# Patient Record
Sex: Female | Born: 1980 | Race: White | Hispanic: No | Smoking: Former smoker
Health system: Southern US, Community
[De-identification: ages and names within clinical notes are randomized; demographics above are authoritative.]

## PROBLEM LIST (undated history)

## (undated) HISTORY — PX: KNEE SURGERY: SHX244

## (undated) HISTORY — PX: MANDIBLE SURGERY: SHX707

## (undated) HISTORY — PX: APPENDECTOMY: SHX54

---

## 2013-11-29 ENCOUNTER — Ambulatory Visit: Payer: Self-pay | Admitting: Physical Therapy

## 2013-12-24 ENCOUNTER — Encounter: Payer: Self-pay | Admitting: Obstetrics & Gynecology

## 2013-12-24 DIAGNOSIS — Z01419 Encounter for gynecological examination (general) (routine) without abnormal findings: Secondary | ICD-10-CM

## 2015-02-20 MED FILL — FLUoxetine HCL 40 MG CAPS: 40 | 30 days supply | Qty: 30 | Fill #3

## 2015-03-17 MED FILL — FLUoxetine HCL 40 MG CAPS: 40 | 30 days supply | Qty: 30 | Fill #4

## 2015-04-28 MED FILL — FLUoxetine HCL 40 MG CAPS: 40 | 30 days supply | Qty: 30 | Fill #5

## 2015-06-06 DIAGNOSIS — F322 Major depressive disorder, single episode, severe without psychotic features: Secondary | ICD-10-CM | POA: Diagnosis not present

## 2015-06-06 MED FILL — FLUoxetine HCL 40 MG CAPS: 40 | 90 days supply | Qty: 90 | Fill #0

## 2015-10-07 MED FILL — FLUoxetine HCL 40 MG CAPS: 40 | 90 days supply | Qty: 90 | Fill #1

## 2016-03-26 DIAGNOSIS — F322 Major depressive disorder, single episode, severe without psychotic features: Secondary | ICD-10-CM | POA: Diagnosis not present

## 2016-03-26 DIAGNOSIS — Z0000 Encounter for general adult medical examination without abnormal findings: Secondary | ICD-10-CM | POA: Diagnosis not present

## 2016-03-26 DIAGNOSIS — Z01419 Encounter for gynecological examination (general) (routine) without abnormal findings: Secondary | ICD-10-CM | POA: Diagnosis not present

## 2016-03-26 MED FILL — FLUoxetine HCL 40 MG CAPS: 40 | 90 days supply | Qty: 90 | Fill #0

## 2016-04-09 DIAGNOSIS — R748 Abnormal levels of other serum enzymes: Secondary | ICD-10-CM | POA: Diagnosis not present

## 2016-04-16 DIAGNOSIS — H527 Unspecified disorder of refraction: Secondary | ICD-10-CM | POA: Diagnosis not present

## 2016-06-10 MED FILL — FLUoxetine HCL 40 MG CAPS: 40 | 90 days supply | Qty: 90 | Fill #0

## 2016-10-12 DIAGNOSIS — N3000 Acute cystitis without hematuria: Secondary | ICD-10-CM | POA: Diagnosis not present

## 2016-10-12 DIAGNOSIS — R109 Unspecified abdominal pain: Secondary | ICD-10-CM | POA: Diagnosis not present

## 2016-10-12 DIAGNOSIS — M545 Low back pain: Secondary | ICD-10-CM | POA: Diagnosis not present

## 2016-10-26 MED FILL — FLUoxetine HCL 40 MG CAPS: 40 | 90 days supply | Qty: 90 | Fill #1

## 2017-01-10 DIAGNOSIS — M79641 Pain in right hand: Secondary | ICD-10-CM | POA: Diagnosis not present

## 2017-01-10 DIAGNOSIS — M189 Osteoarthritis of first carpometacarpal joint, unspecified: Secondary | ICD-10-CM | POA: Diagnosis not present

## 2017-07-25 DIAGNOSIS — F322 Major depressive disorder, single episode, severe without psychotic features: Secondary | ICD-10-CM | POA: Diagnosis not present

## 2017-07-25 DIAGNOSIS — Z0000 Encounter for general adult medical examination without abnormal findings: Secondary | ICD-10-CM | POA: Diagnosis not present

## 2017-07-25 DIAGNOSIS — N898 Other specified noninflammatory disorders of vagina: Secondary | ICD-10-CM | POA: Diagnosis not present

## 2017-07-25 DIAGNOSIS — Z23 Encounter for immunization: Secondary | ICD-10-CM | POA: Diagnosis not present

## 2017-07-25 MED FILL — FLUoxetine HCL 40 MG CAPS: 40 | 30 days supply | Qty: 30 | Fill #0

## 2017-07-25 MED FILL — FLUCONAZOLE 150 MG TABS: 150 | 1 days supply | Qty: 1 | Fill #0

## 2017-08-15 ENCOUNTER — Emergency Department
Admission: EM | Admit: 2017-08-15 | Discharge: 2017-08-15 | Disposition: A | Discharge status: Home / Self Care | Payer: 59 | Source: Home / Self Care | Attending: Family Medicine | Admitting: Family Medicine

## 2017-08-15 ENCOUNTER — Emergency Department (INDEPENDENT_AMBULATORY_CARE_PROVIDER_SITE_OTHER): Payer: 59

## 2017-08-15 ENCOUNTER — Other Ambulatory Visit: Payer: Self-pay

## 2017-08-15 DIAGNOSIS — M79672 Pain in left foot: Secondary | ICD-10-CM | POA: Diagnosis not present

## 2017-08-15 DIAGNOSIS — M25572 Pain in left ankle and joints of left foot: Secondary | ICD-10-CM

## 2017-08-15 DIAGNOSIS — S99912A Unspecified injury of left ankle, initial encounter: Secondary | ICD-10-CM | POA: Diagnosis not present

## 2017-08-15 DIAGNOSIS — M79662 Pain in left lower leg: Secondary | ICD-10-CM | POA: Diagnosis not present

## 2017-08-15 DIAGNOSIS — M7989 Other specified soft tissue disorders: Secondary | ICD-10-CM | POA: Diagnosis not present

## 2017-08-15 DIAGNOSIS — M79605 Pain in left leg: Secondary | ICD-10-CM | POA: Diagnosis not present

## 2017-08-15 DIAGNOSIS — S99922A Unspecified injury of left foot, initial encounter: Secondary | ICD-10-CM | POA: Diagnosis not present

## 2017-08-15 DIAGNOSIS — M7732 Calcaneal spur, left foot: Secondary | ICD-10-CM | POA: Diagnosis not present

## 2017-08-15 DIAGNOSIS — S8992XA Unspecified injury of left lower leg, initial encounter: Secondary | ICD-10-CM | POA: Diagnosis not present

## 2017-08-15 NOTE — ED Triage Notes (Signed)
Got up from couch around 130 today and foot was asleep.  Rolled left ankle.  Swelling on the lateral aspect of ankle and pin in the shin area.

## 2017-08-15 NOTE — Discharge Instructions (Signed)
°  You may take 500mg  acetaminophen every 4-6 hours or in combination with ibuprofen 400-600mg  every 6-8 hours as needed for pain and inflammation.  Please follow up with family medicine or sports medicine in 1-2 weeks if not improving, sooner if worsening.

## 2017-08-15 NOTE — ED Provider Notes (Signed)
[facility]    CSN: [id number] Arrival date & time: 08/15/17  1523     History   Chief Complaint Chief Complaint  Patient presents with  . Ankle Pain    HPI Maria Wilkins is a 37 y.o. female.   HPI  Maria Wilkins is a 37 y.o. female presenting to UC with c/o Left lower leg, ankle, and foot pain that started at 1:30PM today. She got up from the couch and her foot had fallen asleep, when she stood, her ankle rolled.  She has pain and swelling to the lateral aspect of her ankle and foot.  No pain medication taken PTA. Pain is moderate at this time, more severe with weight bearing. Hx of Left ankle sprain in the past but no fractures or surgeries.    History reviewed. No pertinent past medical history.  There are no active problems to display for this patient.   Past Surgical History:  Procedure Laterality Date  . APPENDECTOMY    . KNEE SURGERY    . MANDIBLE SURGERY      OB History   None      Home Medications    Prior to Admission medications   Medication Sig Start Date End Date Taking? Authorizing Provider  FLUoxetine (PROZAC) 40 MG capsule Take 40 mg by mouth daily.   Yes [provider]    Family History History reviewed. No pertinent family history.  Social History Social History   Tobacco Use  . Smoking status: Former Games developer  . Smokeless tobacco: Never Used  Substance Use Topics  . Alcohol use: Yes  . Drug use: Not Currently     Allergies   Penicillins   Review of Systems Review of Systems  Musculoskeletal: Positive for arthralgias, gait problem ( pain with weight bearing), joint swelling and myalgias.  Skin: Negative for color change and wound.  Neurological: Negative for weakness and numbness.     Physical Exam Triage Vital Signs ED Triage Vitals  Enc Vitals Group     BP 08/15/17 1609 112/80     Pulse Rate 08/15/17 1609 69     Resp --      Temp 08/15/17 1609 98.2 F (36.8 C)     Temp Source 08/15/17  1609 Oral     SpO2 08/15/17 1609 98 %     Weight 08/15/17 1611 153 lb (69.4 kg)     Height 08/15/17 1611 5\' 4"  (1.626 m)     Head Circumference --      Peak Flow --      Pain Score 08/15/17 1610 6     Pain Loc --      Pain Edu? --      Excl. in GC? --    No data found.  Updated Vital Signs BP 112/80 (BP Location: Right Arm)   Pulse 69   Temp 98.2 F (36.8 C) (Oral)   Ht 5\' 4"  (1.626 m)   Wt 153 lb (69.4 kg)   LMP 08/07/2017   SpO2 98%   BMI 26.26 kg/m   Visual Acuity Right Eye Distance:   Left Eye Distance:   Bilateral Distance:    Right Eye Near:   Left Eye Near:    Bilateral Near:     Physical Exam  Constitutional: She is oriented to person, place, and time. She appears well-developed and well-nourished. No distress.  HENT:  Head: Normocephalic and atraumatic.  Eyes: EOM are normal.  Neck: Normal range of motion.  Cardiovascular: Normal rate.  Pulses:      Dorsalis pedis pulses are 2+ on the left side.       Posterior tibial pulses are 2+ on the left side.  Pulmonary/Chest: Effort normal.  Musculoskeletal: Normal range of motion. She exhibits edema and tenderness.  Left lower leg: calf is soft, tenderness over proximal lateral aspect. Full ROM knee. No knee tenderness. Left ankle and foot: mild edema to lateral aspect with tenderness.  Full ROM ankle and toes.   Neurological: She is alert and oriented to person, place, and time.  Skin: Skin is warm and dry. Capillary refill takes less than 2 seconds. She is not diaphoretic.  Left lower leg, ankle and foot: skin in tact. No ecchymosis or erythema  Psychiatric: She has a normal mood and affect. Her behavior is normal.  Nursing note and vitals reviewed.    UC Treatments / Results  Labs (all labs ordered are listed, but only abnormal results are displayed) Labs Reviewed - No data to display  EKG None  Radiology Dg Tibia/fibula Left  Result Date: 08/15/2017 CLINICAL DATA:  Left leg pain after injury  today. EXAM: LEFT TIBIA AND FIBULA - 2 VIEW COMPARISON:  None. FINDINGS: There is no evidence of fracture or other focal bone lesions. Soft tissues are unremarkable. IMPRESSION: Normal left tibia and fibula. Electronically Signed   By: Lupita RaiderJames  Green Jr, M.D.   On: 08/15/2017 16:47   Dg Ankle Complete Left  Result Date: 08/15/2017 CLINICAL DATA:  Twisting injury.  Pain. EXAM: LEFT ANKLE COMPLETE - 3+ VIEW COMPARISON:  None FINDINGS: There is no evidence of fracture, dislocation, or joint effusion. There is no evidence of arthropathy or other focal bone abnormality. Mild soft tissue swelling over the lateral malleolus. Mild enthesopathy off the dorsum of the calcaneus. Accessory ossicle noted adjacent to the cuboid. IMPRESSION: No acute fracture nor joint dislocation. Mild soft tissue swelling over the lateral malleolus. Electronically Signed   By: Tollie Ethavid  Kwon M.D.   On: 08/15/2017 17:10   Dg Foot Complete Left  Result Date: 08/15/2017 CLINICAL DATA:  Left lateral foot and ankle pain after twisting injury. EXAM: LEFT FOOT - COMPLETE 3+ VIEW COMPARISON:  None. FINDINGS: Calcaneal enthesopathy is noted off the dorsal aspect of the calcaneus. No acute fracture nor joint dislocation. Mild soft tissue swelling over the lateral malleolus. IMPRESSION: Soft tissue swelling over the lateral malleolus. No acute fracture nor joint dislocation of the left foot. Dorsal calcaneal enthesopathy. Electronically Signed   By: Tollie Ethavid  Kwon M.D.   On: 08/15/2017 17:09    Procedures Procedures (including critical care time)  Medications Ordered in UC Medications - No data to display  Initial Impression / Assessment and Plan / UC Course  I have reviewed the triage vital signs and the nursing notes.  Pertinent labs & imaging results that were available during my care of the patient were reviewed by me and considered in my medical decision making (see chart for details).     Discussed imaging with pt. Will tx as  sprain Ankle brace and crutches provided for comfort Pt declined prescription pain medication.  Final Clinical Impressions(s) / UC Diagnoses   Final diagnoses:  Pain in left lower leg  Pain in joint involving left ankle and foot     Discharge Instructions      You may take 500mg  acetaminophen every 4-6 hours or in combination with ibuprofen 400-600mg  every 6-8 hours as needed for pain and inflammation.  Please follow up  with family medicine or sports medicine in 1-2 weeks if not improving, sooner if worsening.     ED Prescriptions    None     Controlled Substance Prescriptions  Controlled Substance Registry consulted? Not Applicable   Rolla Plate 08/16/17 6045

## 2017-09-01 MED FILL — FLUoxetine HCL 40 MG CAPS: 40 | 30 days supply | Qty: 30 | Fill #1

## 2017-09-22 DIAGNOSIS — J069 Acute upper respiratory infection, unspecified: Secondary | ICD-10-CM | POA: Diagnosis not present

## 2017-09-30 DIAGNOSIS — Z79899 Other long term (current) drug therapy: Secondary | ICD-10-CM | POA: Diagnosis not present

## 2017-09-30 DIAGNOSIS — Z87891 Personal history of nicotine dependence: Secondary | ICD-10-CM | POA: Diagnosis not present

## 2017-09-30 DIAGNOSIS — S161XXA Strain of muscle, fascia and tendon at neck level, initial encounter: Secondary | ICD-10-CM | POA: Diagnosis not present

## 2017-09-30 DIAGNOSIS — Z880 Allergy status to penicillin: Secondary | ICD-10-CM | POA: Diagnosis not present

## 2017-09-30 DIAGNOSIS — G8911 Acute pain due to trauma: Secondary | ICD-10-CM | POA: Diagnosis not present

## 2017-09-30 DIAGNOSIS — Z881 Allergy status to other antibiotic agents status: Secondary | ICD-10-CM | POA: Diagnosis not present

## 2017-10-17 DIAGNOSIS — S161XXD Strain of muscle, fascia and tendon at neck level, subsequent encounter: Secondary | ICD-10-CM | POA: Diagnosis not present

## 2017-11-03 MED FILL — FLUoxetine HCL 40 MG CAPS: 40 | 30 days supply | Qty: 30 | Fill #2

## 2017-12-05 MED FILL — FLUTICASONE PROP 50 MCG SPR: 50 | 30 days supply | Qty: 16 | Fill #0

## 2017-12-09 MED FILL — FLUoxetine HCL 40 MG CAPS: 40 | 30 days supply | Qty: 30 | Fill #2

## 2018-01-19 DIAGNOSIS — H527 Unspecified disorder of refraction: Secondary | ICD-10-CM | POA: Diagnosis not present

## 2018-01-20 DIAGNOSIS — M79652 Pain in left thigh: Secondary | ICD-10-CM | POA: Diagnosis not present

## 2018-01-21 DIAGNOSIS — M79652 Pain in left thigh: Secondary | ICD-10-CM | POA: Diagnosis not present

## 2018-03-01 DIAGNOSIS — M792 Neuralgia and neuritis, unspecified: Secondary | ICD-10-CM | POA: Diagnosis not present

## 2018-03-10 MED FILL — SCOPOLAMINE 1 MG/3DAYS PT72: 1 | 12 days supply | Qty: 4 | Fill #0

## 2018-04-02 DIAGNOSIS — M549 Dorsalgia, unspecified: Secondary | ICD-10-CM | POA: Diagnosis not present

## 2018-08-04 DIAGNOSIS — H40013 Open angle with borderline findings, low risk, bilateral: Secondary | ICD-10-CM | POA: Diagnosis not present

## 2018-08-30 DIAGNOSIS — Z0000 Encounter for general adult medical examination without abnormal findings: Secondary | ICD-10-CM | POA: Diagnosis not present

## 2018-09-01 DIAGNOSIS — K219 Gastro-esophageal reflux disease without esophagitis: Secondary | ICD-10-CM | POA: Diagnosis not present

## 2018-09-01 DIAGNOSIS — E785 Hyperlipidemia, unspecified: Secondary | ICD-10-CM | POA: Diagnosis not present

## 2018-09-01 DIAGNOSIS — Z0000 Encounter for general adult medical examination without abnormal findings: Secondary | ICD-10-CM | POA: Diagnosis not present

## 2018-09-01 DIAGNOSIS — F322 Major depressive disorder, single episode, severe without psychotic features: Secondary | ICD-10-CM | POA: Diagnosis not present

## 2018-09-01 MED FILL — FLUoxetine HCL 20 MG CAPS: 20 | 30 days supply | Qty: 60 | Fill #0

## 2018-09-01 MED FILL — PANTOPRAZOLE SOD DR 40 MG T: 40 | 30 days supply | Qty: 30 | Fill #0

## 2018-10-02 MED FILL — PANTOPRAZOLE SOD DR 40 MG T: 40 | 30 days supply | Qty: 30 | Fill #1

## 2018-10-02 MED FILL — FLUoxetine HCL 20 MG CAPS: 20 | 30 days supply | Qty: 60 | Fill #1

## 2018-11-03 MED FILL — FLUoxetine HCL 20 MG CAPS: 20 | 30 days supply | Qty: 60 | Fill #2

## 2018-11-03 MED FILL — PANTOPRAZOLE SOD DR 40 MG T: 40 | 30 days supply | Qty: 30 | Fill #2

## 2018-11-28 DIAGNOSIS — M79671 Pain in right foot: Secondary | ICD-10-CM | POA: Diagnosis not present

## 2018-11-28 DIAGNOSIS — M79672 Pain in left foot: Secondary | ICD-10-CM | POA: Diagnosis not present

## 2018-12-12 MED FILL — FLUoxetine HCL 20 MG CAPS: 20 | 90 days supply | Qty: 180 | Fill #0

## 2018-12-12 MED FILL — PANTOPRAZOLE SOD DR 40 MG T: 40 | 90 days supply | Qty: 90 | Fill #0

## 2019-01-15 DIAGNOSIS — M79671 Pain in right foot: Secondary | ICD-10-CM | POA: Diagnosis not present

## 2019-01-15 DIAGNOSIS — M79672 Pain in left foot: Secondary | ICD-10-CM | POA: Diagnosis not present

## 2019-01-28 DIAGNOSIS — M545 Low back pain: Secondary | ICD-10-CM | POA: Diagnosis not present

## 2019-01-28 DIAGNOSIS — Z881 Allergy status to other antibiotic agents status: Secondary | ICD-10-CM | POA: Diagnosis not present

## 2019-01-28 DIAGNOSIS — F329 Major depressive disorder, single episode, unspecified: Secondary | ICD-10-CM | POA: Diagnosis not present

## 2019-01-28 DIAGNOSIS — Z880 Allergy status to penicillin: Secondary | ICD-10-CM | POA: Diagnosis not present

## 2019-01-28 DIAGNOSIS — Z79899 Other long term (current) drug therapy: Secondary | ICD-10-CM | POA: Diagnosis not present

## 2019-01-28 DIAGNOSIS — Z87891 Personal history of nicotine dependence: Secondary | ICD-10-CM | POA: Diagnosis not present

## 2019-01-28 DIAGNOSIS — M62830 Muscle spasm of back: Secondary | ICD-10-CM | POA: Diagnosis not present

## 2019-02-26 DIAGNOSIS — M79671 Pain in right foot: Secondary | ICD-10-CM | POA: Diagnosis not present

## 2019-02-26 DIAGNOSIS — M79672 Pain in left foot: Secondary | ICD-10-CM | POA: Diagnosis not present

## 2019-02-26 IMAGING — DX DG FOOT COMPLETE 3+V*L*
3 series · 3 of 3 positions shown · non-contrast
Comparison: None.

CLINICAL DATA: Left lateral foot and ankle pain after twisting
injury.

EXAM:
LEFT FOOT - COMPLETE 3+ VIEW

[foot ap]
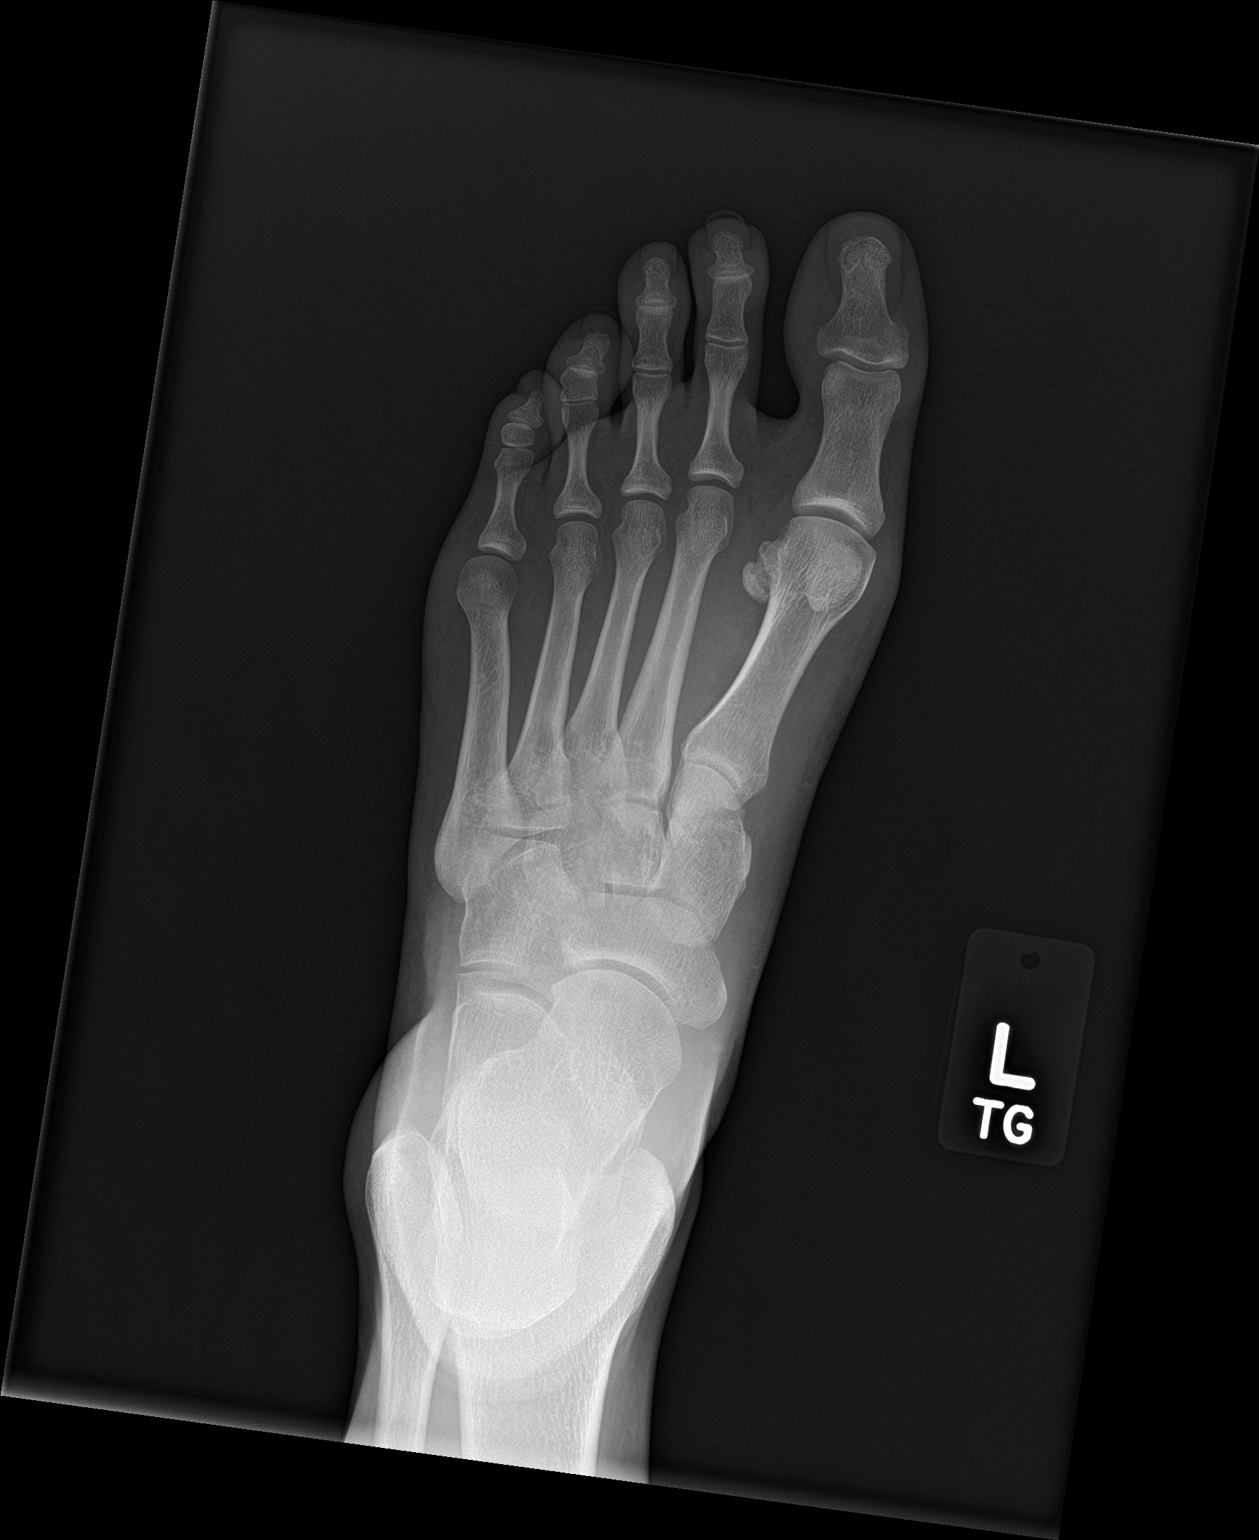

[foot obl]
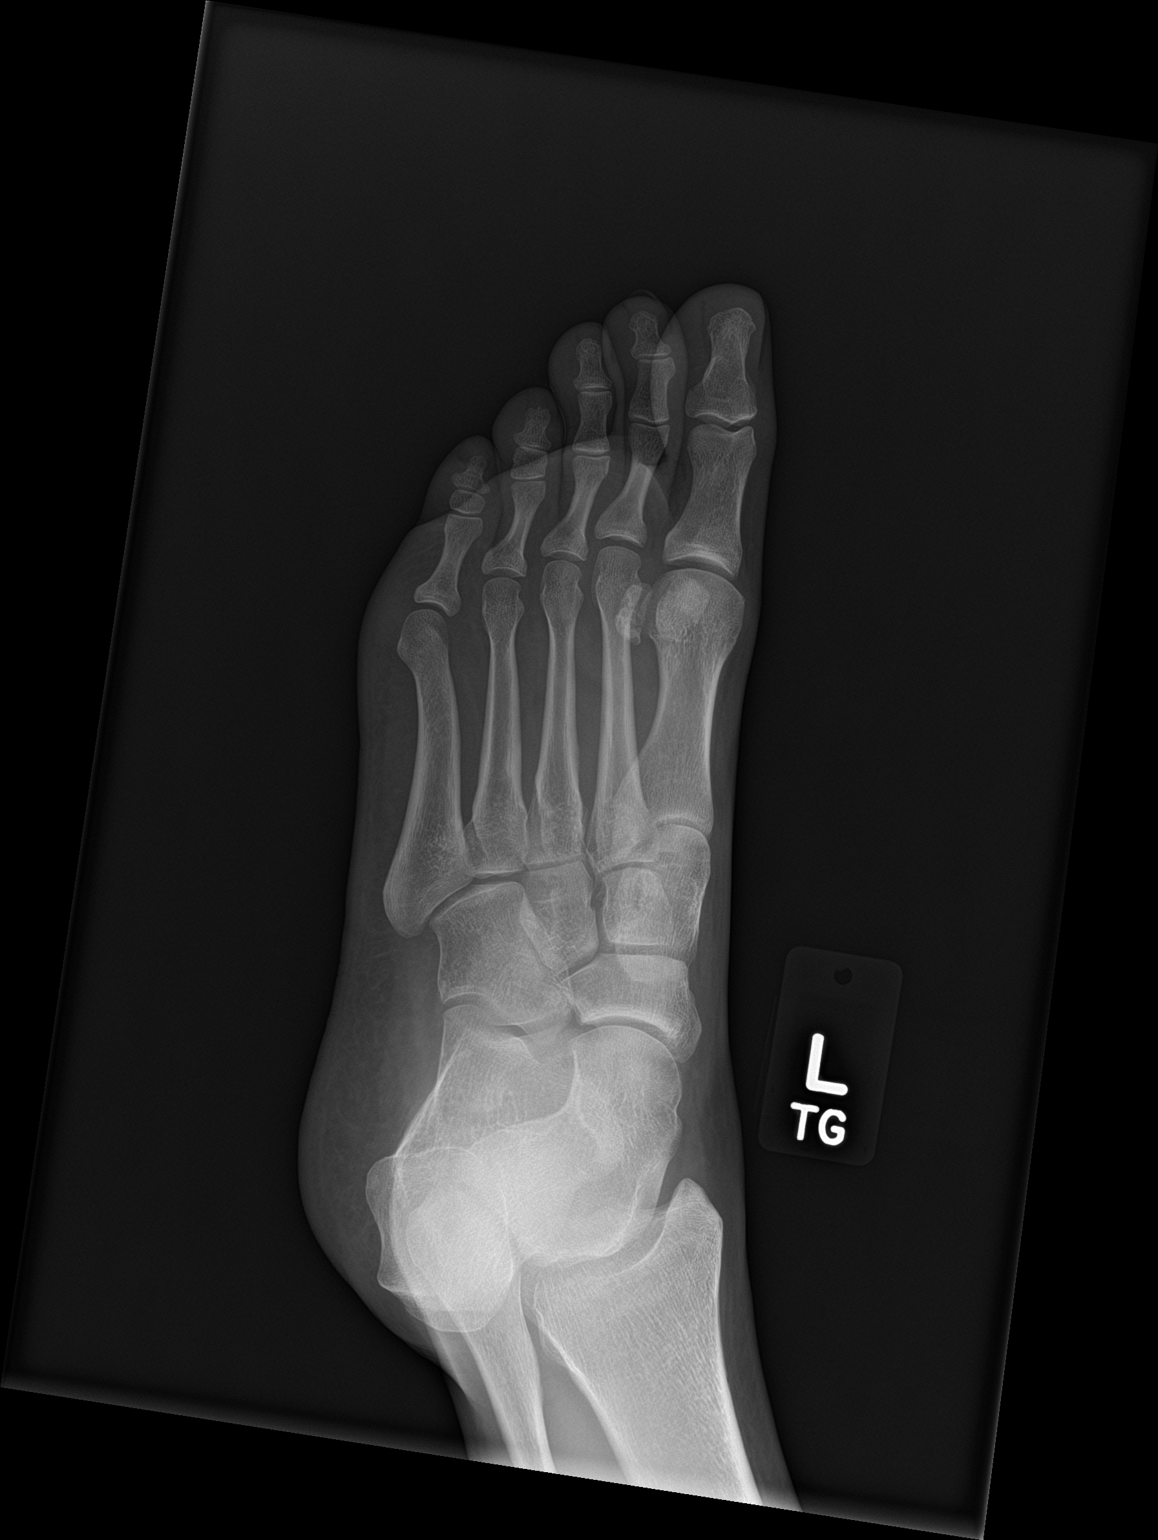

[foot lat]
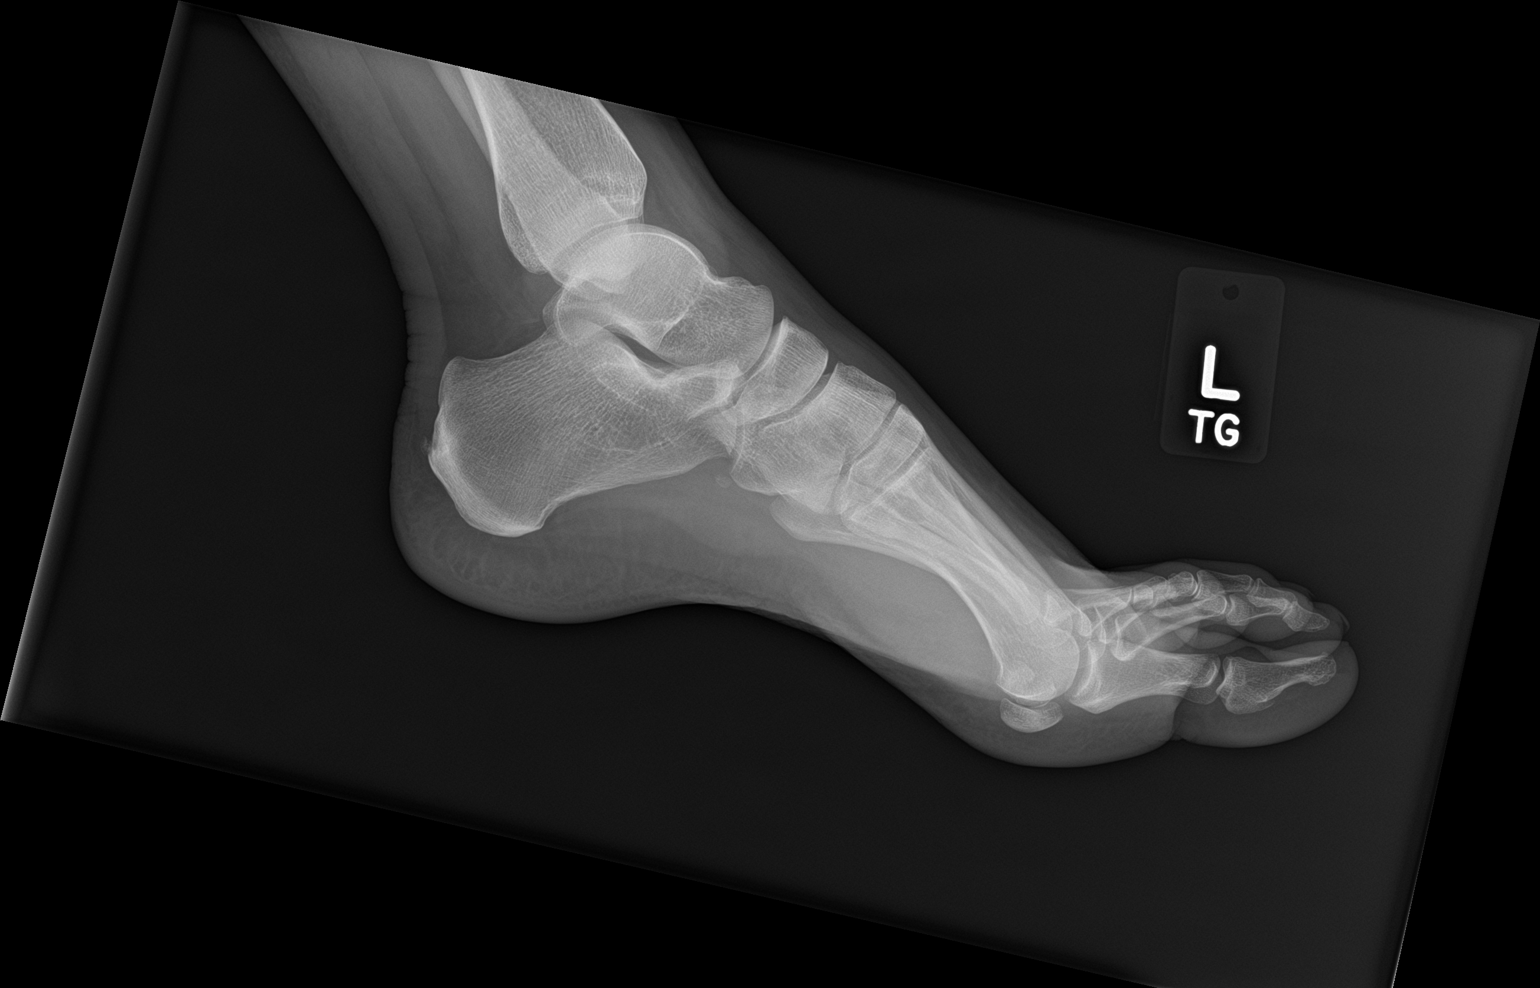

[3 of 3 positions shown; findings below may reference images not displayed]

FINDINGS: Calcaneal enthesopathy is noted off the dorsal aspect of the
calcaneus. No acute fracture nor joint dislocation. Mild soft tissue
swelling over the lateral malleolus.
IMPRESSION: Soft tissue swelling over the lateral malleolus. No acute fracture
nor joint dislocation of the left foot. Dorsal calcaneal
enthesopathy.

## 2019-02-26 IMAGING — DX DG TIBIA/FIBULA 2V*L*
2 series · 2 of 2 positions shown · non-contrast
Comparison: None.

CLINICAL DATA: Left leg pain after injury today.

EXAM:
LEFT TIBIA AND FIBULA - 2 VIEW

[tibia ap]
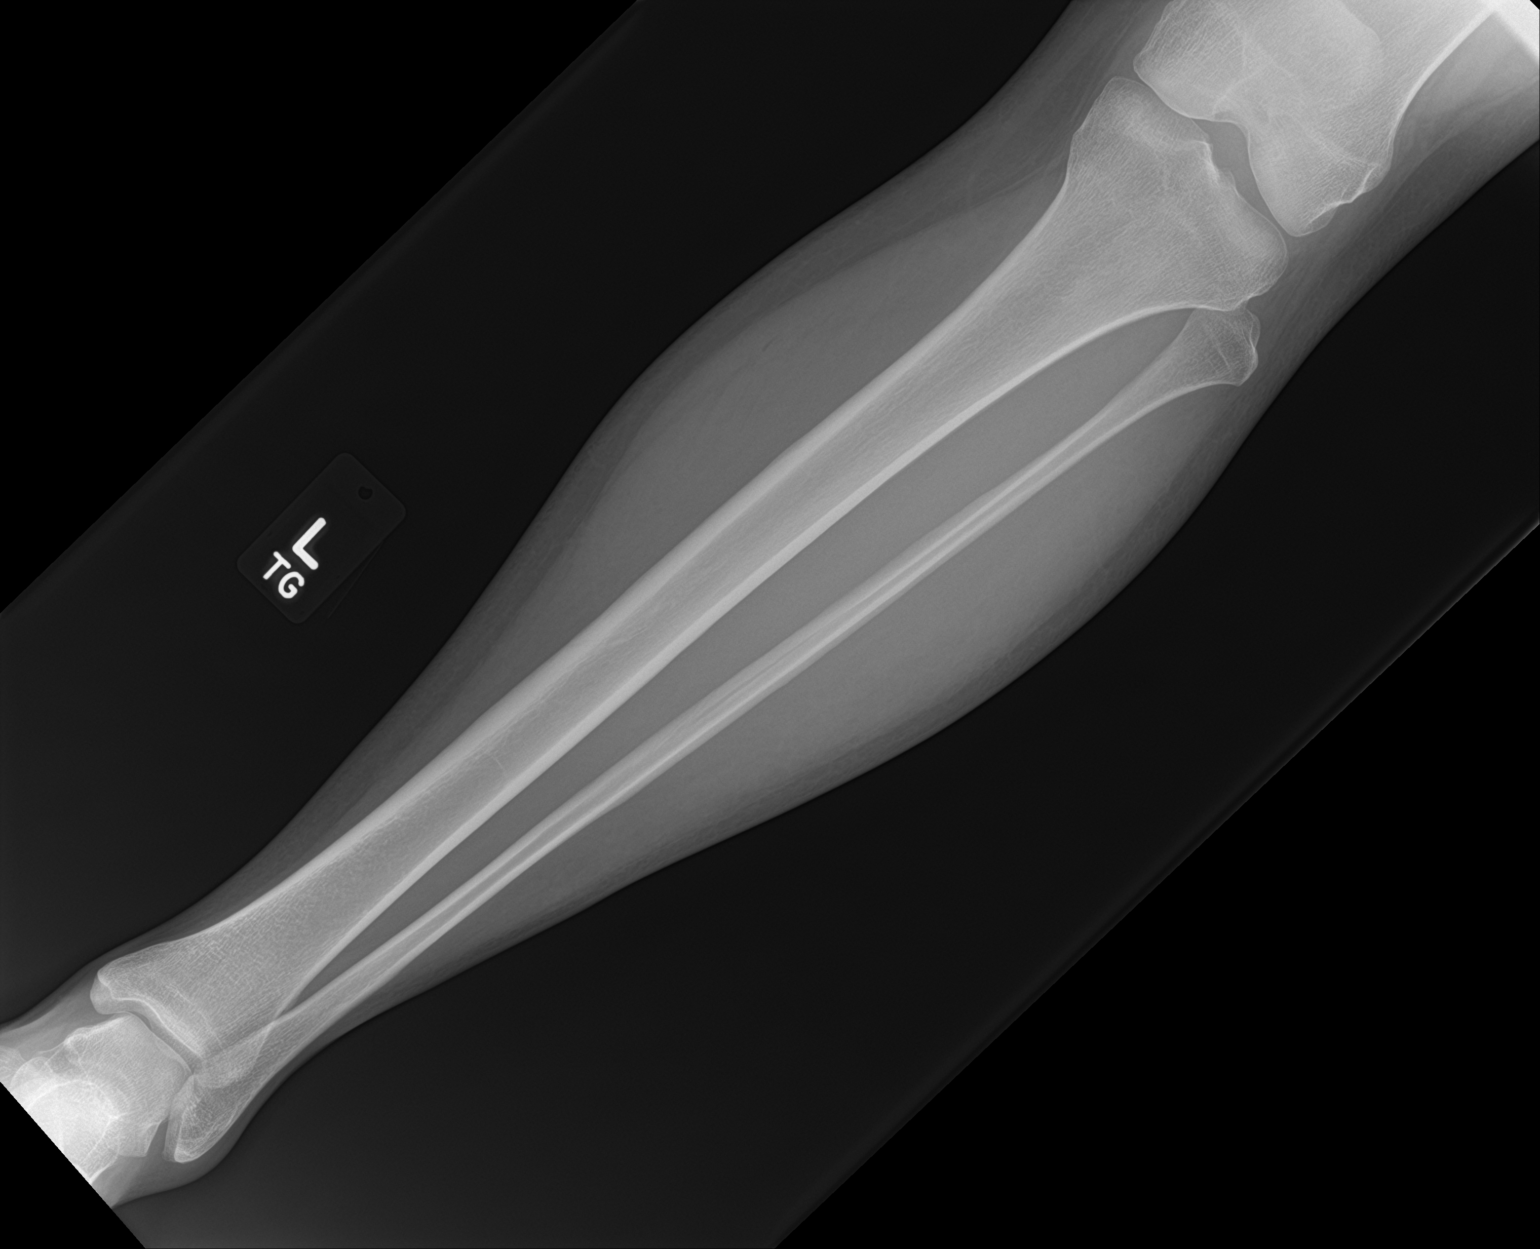

[tibia lat]
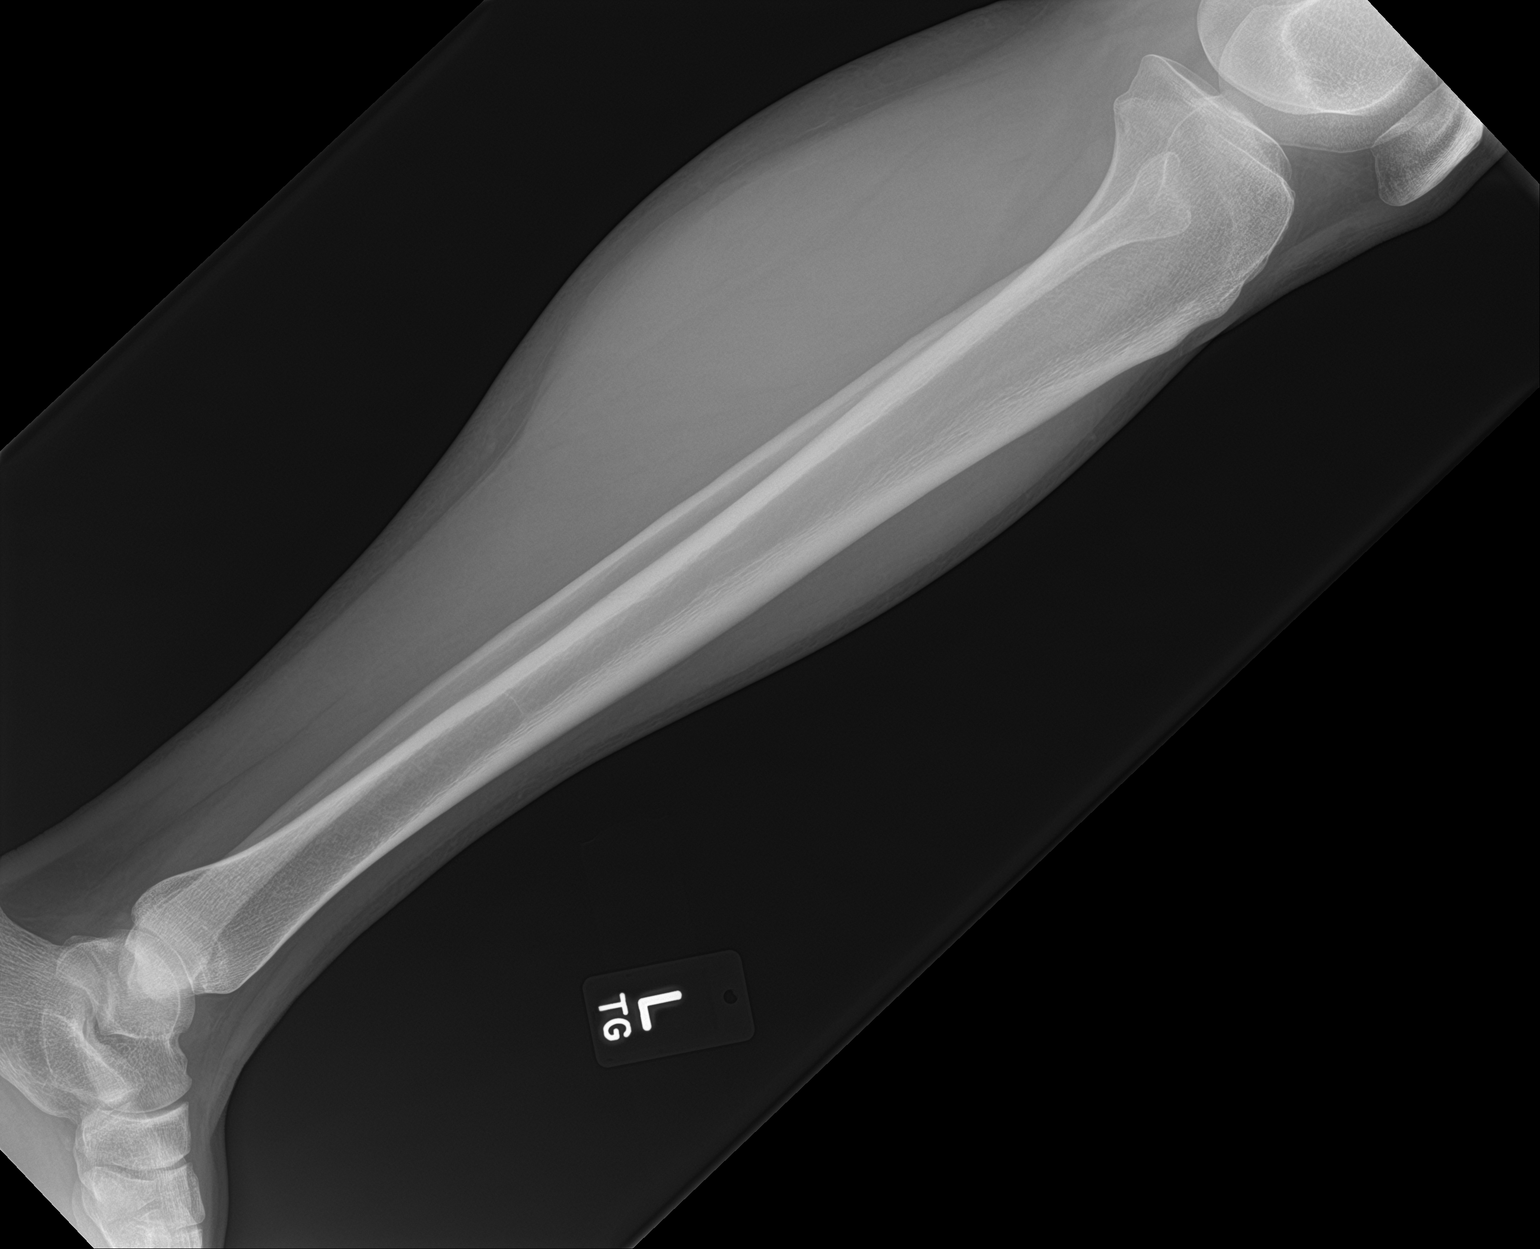

[2 of 2 positions shown; findings below may reference images not displayed]

FINDINGS: There is no evidence of fracture or other focal bone lesions. Soft
tissues are unremarkable.
IMPRESSION: Normal left tibia and fibula.

## 2019-02-27 DIAGNOSIS — N898 Other specified noninflammatory disorders of vagina: Secondary | ICD-10-CM | POA: Diagnosis not present

## 2019-02-27 DIAGNOSIS — N926 Irregular menstruation, unspecified: Secondary | ICD-10-CM | POA: Diagnosis not present

## 2019-02-28 MED FILL — POTASSIUM CHLORIDE CRYS ER: 10 | 5 days supply | Qty: 10 | Fill #0

## 2019-03-06 DIAGNOSIS — E876 Hypokalemia: Secondary | ICD-10-CM | POA: Diagnosis not present

## 2019-04-09 DIAGNOSIS — F322 Major depressive disorder, single episode, severe without psychotic features: Secondary | ICD-10-CM | POA: Diagnosis not present

## 2019-04-09 MED FILL — ESCITALOPRAM 20 MG TABLET: 20 | 30 days supply | Qty: 30 | Fill #0

## 2019-04-27 DIAGNOSIS — J22 Unspecified acute lower respiratory infection: Secondary | ICD-10-CM | POA: Diagnosis not present

## 2019-04-27 DIAGNOSIS — F322 Major depressive disorder, single episode, severe without psychotic features: Secondary | ICD-10-CM | POA: Diagnosis not present

## 2019-05-07 MED FILL — ESCITALOPRAM 20 MG TABLET: 20 | 30 days supply | Qty: 30 | Fill #1

## 2019-06-08 MED FILL — ESCITALOPRAM 20 MG TABLET: 20 | 30 days supply | Qty: 30 | Fill #2

## 2019-06-12 MED FILL — PANTOPRAZOLE SOD DR 40 MG T: 40 | 90 days supply | Qty: 90 | Fill #0

## 2019-06-19 DIAGNOSIS — J45990 Exercise induced bronchospasm: Secondary | ICD-10-CM | POA: Diagnosis not present

## 2019-06-19 DIAGNOSIS — F322 Major depressive disorder, single episode, severe without psychotic features: Secondary | ICD-10-CM | POA: Diagnosis not present

## 2019-06-19 DIAGNOSIS — Z01419 Encounter for gynecological examination (general) (routine) without abnormal findings: Secondary | ICD-10-CM | POA: Diagnosis not present

## 2019-06-19 DIAGNOSIS — Z0000 Encounter for general adult medical examination without abnormal findings: Secondary | ICD-10-CM | POA: Diagnosis not present

## 2019-07-10 MED FILL — ESCITALOPRAM 20 MG TABLET: 20 | 30 days supply | Qty: 30 | Fill #0

## 2019-07-25 DIAGNOSIS — M722 Plantar fascial fibromatosis: Secondary | ICD-10-CM | POA: Diagnosis not present

## 2019-08-17 MED FILL — ESCITALOPRAM 20 MG TABLET: 20 | 90 days supply | Qty: 90 | Fill #0

## 2019-09-17 MED FILL — PANTOPRAZOLE SOD DR 40 MG T: 40 | 90 days supply | Qty: 90 | Fill #0

## 2019-09-24 DIAGNOSIS — M722 Plantar fascial fibromatosis: Secondary | ICD-10-CM | POA: Diagnosis not present

## 2019-09-27 DIAGNOSIS — M79671 Pain in right foot: Secondary | ICD-10-CM | POA: Diagnosis not present

## 2019-09-27 DIAGNOSIS — M6281 Muscle weakness (generalized): Secondary | ICD-10-CM | POA: Diagnosis not present

## 2019-09-27 DIAGNOSIS — M79672 Pain in left foot: Secondary | ICD-10-CM | POA: Diagnosis not present

## 2019-09-27 DIAGNOSIS — M722 Plantar fascial fibromatosis: Secondary | ICD-10-CM | POA: Diagnosis not present

## 2019-10-02 DIAGNOSIS — M722 Plantar fascial fibromatosis: Secondary | ICD-10-CM | POA: Diagnosis not present

## 2019-10-02 DIAGNOSIS — M79671 Pain in right foot: Secondary | ICD-10-CM | POA: Diagnosis not present

## 2019-10-02 DIAGNOSIS — M6281 Muscle weakness (generalized): Secondary | ICD-10-CM | POA: Diagnosis not present

## 2019-10-02 DIAGNOSIS — M79672 Pain in left foot: Secondary | ICD-10-CM | POA: Diagnosis not present

## 2019-10-12 DIAGNOSIS — M6281 Muscle weakness (generalized): Secondary | ICD-10-CM | POA: Diagnosis not present

## 2019-10-12 DIAGNOSIS — M79671 Pain in right foot: Secondary | ICD-10-CM | POA: Diagnosis not present

## 2019-10-12 DIAGNOSIS — M79672 Pain in left foot: Secondary | ICD-10-CM | POA: Diagnosis not present

## 2019-10-26 DIAGNOSIS — M722 Plantar fascial fibromatosis: Secondary | ICD-10-CM | POA: Diagnosis not present

## 2019-10-26 DIAGNOSIS — M79671 Pain in right foot: Secondary | ICD-10-CM | POA: Diagnosis not present

## 2019-10-26 DIAGNOSIS — M6281 Muscle weakness (generalized): Secondary | ICD-10-CM | POA: Diagnosis not present

## 2019-10-26 DIAGNOSIS — M79672 Pain in left foot: Secondary | ICD-10-CM | POA: Diagnosis not present

## 2019-11-01 DIAGNOSIS — M79671 Pain in right foot: Secondary | ICD-10-CM | POA: Diagnosis not present

## 2019-11-01 DIAGNOSIS — M79672 Pain in left foot: Secondary | ICD-10-CM | POA: Diagnosis not present

## 2019-11-01 DIAGNOSIS — M6281 Muscle weakness (generalized): Secondary | ICD-10-CM | POA: Diagnosis not present

## 2019-11-01 DIAGNOSIS — M722 Plantar fascial fibromatosis: Secondary | ICD-10-CM | POA: Diagnosis not present

## 2019-11-07 DIAGNOSIS — M6281 Muscle weakness (generalized): Secondary | ICD-10-CM | POA: Diagnosis not present

## 2019-11-07 DIAGNOSIS — M79671 Pain in right foot: Secondary | ICD-10-CM | POA: Diagnosis not present

## 2019-11-07 DIAGNOSIS — M722 Plantar fascial fibromatosis: Secondary | ICD-10-CM | POA: Diagnosis not present

## 2019-11-07 DIAGNOSIS — M79672 Pain in left foot: Secondary | ICD-10-CM | POA: Diagnosis not present

## 2019-11-09 DIAGNOSIS — M722 Plantar fascial fibromatosis: Secondary | ICD-10-CM | POA: Diagnosis not present

## 2019-11-09 DIAGNOSIS — M6281 Muscle weakness (generalized): Secondary | ICD-10-CM | POA: Diagnosis not present

## 2019-11-09 DIAGNOSIS — M79672 Pain in left foot: Secondary | ICD-10-CM | POA: Diagnosis not present

## 2019-11-09 DIAGNOSIS — M79671 Pain in right foot: Secondary | ICD-10-CM | POA: Diagnosis not present

## 2019-11-12 DIAGNOSIS — M722 Plantar fascial fibromatosis: Secondary | ICD-10-CM | POA: Diagnosis not present

## 2019-11-19 ENCOUNTER — Other Ambulatory Visit (HOSPITAL_COMMUNITY): Payer: Self-pay | Admitting: Family Medicine

## 2019-11-19 MED FILL — ESCITALOPRAM 20 MG TABLET: 20 | 90 days supply | Qty: 90 | Fill #0

## 2019-11-26 ENCOUNTER — Ambulatory Visit: Payer: 59 | Admitting: Podiatry

## 2019-12-03 ENCOUNTER — Other Ambulatory Visit (HOSPITAL_COMMUNITY): Payer: Self-pay | Admitting: Family Medicine

## 2019-12-06 DIAGNOSIS — Z23 Encounter for immunization: Secondary | ICD-10-CM | POA: Diagnosis not present

## 2019-12-06 MED FILL — PANTOPRAZOLE SOD DR 40 MG T: 40 | 90 days supply | Qty: 90 | Fill #0

## 2019-12-19 ENCOUNTER — Ambulatory Visit: Payer: 59 | Admitting: Podiatry

## 2019-12-27 ENCOUNTER — Ambulatory Visit: Payer: 59 | Admitting: Podiatry

## 2020-01-17 ENCOUNTER — Ambulatory Visit: Payer: 59 | Admitting: Podiatry

## 2020-01-17 ENCOUNTER — Ambulatory Visit (INDEPENDENT_AMBULATORY_CARE_PROVIDER_SITE_OTHER): Payer: 59

## 2020-01-17 ENCOUNTER — Encounter: Payer: Self-pay | Admitting: Podiatry

## 2020-01-17 ENCOUNTER — Other Ambulatory Visit: Payer: Self-pay

## 2020-01-17 DIAGNOSIS — M722 Plantar fascial fibromatosis: Secondary | ICD-10-CM | POA: Diagnosis not present

## 2020-01-17 DIAGNOSIS — M79672 Pain in left foot: Secondary | ICD-10-CM

## 2020-01-17 DIAGNOSIS — M79671 Pain in right foot: Secondary | ICD-10-CM | POA: Diagnosis not present

## 2020-01-18 NOTE — Progress Notes (Signed)
Subjective:   Patient ID: Maria Wilkins, female   DOB: 39 y.o.   MRN: [id number]   HPI Patient presents stating that she has severe pain in her heel bilateral and its been going on for years and has significant family history of this condition.  States she has had numerous injections which were unsuccessful has tried other treatments and is tired of the pain and not being able to be active and is interested in surgical correction of deformity.  Patient does not smoke likes to be active   Review of Systems  All other systems reviewed and are negative.       Objective:  Physical Exam Vitals and nursing note reviewed.  Constitutional:      Appearance: She is well-developed and well-nourished.  Cardiovascular:     Pulses: Intact distal pulses.  Pulmonary:     Effort: Pulmonary effort is normal.  Musculoskeletal:        General: Normal range of motion.  Skin:    General: Skin is warm.  Neurological:     Mental Status: She is alert.     Neurovascular status found to be intact muscle strength adequate range of motion within normal limits.  Patient is noted to have exquisite discomfort medial fascial band of the heel bilateral with partial central and lateral involvement but most of the pain is medial side and at where it started.  Patient does walk with an a propulsive gait pattern due to the intensity of discomfort and has not had relief with numerous conservative treatments over the years.  Patient does not have equinus condition has good digital perfusion well oriented x3     Assessment:  Acute plantar fasciitis bilateral inflammation fluid that is not responded to care and has a chronic nature to condition     Plan:  H&P reviewed condition and recommended endoscopic release due to intensity of discomfort failure to respond conservatively and length of problem.  Patient wants surgery and I allowed her to read consent form for correction of left 1 first going over all possible  complications alternative treatments.  Patient understands no guarantee and she has had in a propulsive gait for a long time and it may take a long time for her to regain normal structure and gait and pain may persist.  She understands all this and wants surgery signed a consent form and is scheduled for outpatient surgery with encouragement of questions which may come up.  I did also dispensed air fracture walker that I want her to get used to now prior to procedure and will bring with her to the surgical center  X-rays taken today were negative for signs of spurring negative for signs of stress fracture arthritis

## 2020-01-21 ENCOUNTER — Other Ambulatory Visit: Payer: Self-pay | Admitting: Podiatry

## 2020-01-21 DIAGNOSIS — M722 Plantar fascial fibromatosis: Secondary | ICD-10-CM

## 2020-02-14 ENCOUNTER — Telehealth: Payer: Self-pay

## 2020-02-14 ENCOUNTER — Other Ambulatory Visit (HOSPITAL_COMMUNITY): Payer: Self-pay | Admitting: Nurse Practitioner

## 2020-02-14 MED FILL — FLUCONAZOLE 150 MG TABS: 150 | 1 days supply | Qty: 1 | Fill #0

## 2020-02-14 NOTE — Telephone Encounter (Signed)
DOS 02/26/2020  EPF LT - 89381  [facility] EFFECTIVE DATE - 02/09/2020  PLAN DEDUCTIBLE - $300.00 W/ $300.00 REMAIINING OUT OF POCKET - $7900.00 W/ $7900.00 REMAINING COPAY $0.00 COINSURANCE - 60%  SPOKE TO SUNDRAL AT [facility], SHE STATED NO PRECERT REQUIRED FOR CPT [phone]. CALL REF # [id number]

## 2020-02-19 ENCOUNTER — Encounter: Payer: Self-pay | Admitting: Podiatry

## 2020-02-19 ENCOUNTER — Telehealth: Payer: Self-pay | Admitting: *Deleted

## 2020-02-19 NOTE — Telephone Encounter (Signed)
Patient is wanting to know if Dr. Charlsie Merles would consider performing surgery on both feet (Jan. 18th),please advise?

## 2020-02-19 NOTE — Telephone Encounter (Signed)
She needs to wear a boot for several weeks so cannot do both at same time. I can do fairly close together

## 2020-02-20 NOTE — Telephone Encounter (Signed)
Called and left VM for patient concerning questions about upcoming surgery.

## 2020-02-26 ENCOUNTER — Other Ambulatory Visit: Payer: Self-pay | Admitting: Podiatry

## 2020-02-26 DIAGNOSIS — M722 Plantar fascial fibromatosis: Secondary | ICD-10-CM | POA: Diagnosis not present

## 2020-02-26 MED ORDER — HYDROCODONE-ACETAMINOPHEN 10-325 MG PO TABS: 1.0000 | ORAL_TABLET | Freq: Three times a day (TID) | ORAL | 0 refills | Status: DC | PRN

## 2020-02-26 MED FILL — ESCITALOPRAM 20 MG TABLET: 20 | 90 days supply | Qty: 90 | Fill #0

## 2020-02-26 MED FILL — HYDROCODON-APAP 10-325: 10-325 | 5 days supply | Qty: 15 | Fill #0

## 2020-02-29 ENCOUNTER — Encounter: Payer: Self-pay | Admitting: Podiatry

## 2020-03-03 ENCOUNTER — Encounter: Payer: 59 | Admitting: Podiatry

## 2020-03-03 ENCOUNTER — Encounter: Payer: Self-pay | Admitting: Podiatry

## 2020-03-03 ENCOUNTER — Ambulatory Visit (INDEPENDENT_AMBULATORY_CARE_PROVIDER_SITE_OTHER): Payer: 59 | Admitting: Podiatry

## 2020-03-03 ENCOUNTER — Other Ambulatory Visit: Payer: Self-pay

## 2020-03-03 DIAGNOSIS — M722 Plantar fascial fibromatosis: Secondary | ICD-10-CM | POA: Diagnosis not present

## 2020-03-03 NOTE — Progress Notes (Signed)
Subjective:   Patient ID: Maria Wilkins, female   DOB: 40 y.o.   MRN: [id number]   HPI Patient states the left heel seems to be improving and continues to have significant discomfort plantar aspect right heel   ROS      Objective:  Physical Exam  Neurovascular status intact negative Denna Haggard' sign noted left heel healing well wound edges well coapted mild plantar pain noted     Assessment:  Doing well post endoscopic release left with exquisite discomfort still in the right plantar fashion     Plan:  H&P reviewed condition and reapplied sterile dressing continue immobilization and dispensed surgical shoe to gradually start wearing.  Discussed correction of the right she wants to get it done soon tentatively scheduled for surgery 3 weeks and will be seen back to reevaluate

## 2020-03-17 ENCOUNTER — Other Ambulatory Visit: Payer: Self-pay

## 2020-03-17 ENCOUNTER — Ambulatory Visit (INDEPENDENT_AMBULATORY_CARE_PROVIDER_SITE_OTHER): Payer: 59

## 2020-03-17 ENCOUNTER — Ambulatory Visit (INDEPENDENT_AMBULATORY_CARE_PROVIDER_SITE_OTHER): Payer: 59 | Admitting: Podiatry

## 2020-03-17 ENCOUNTER — Encounter: Payer: Self-pay | Admitting: Podiatry

## 2020-03-17 DIAGNOSIS — M722 Plantar fascial fibromatosis: Secondary | ICD-10-CM

## 2020-03-17 NOTE — Progress Notes (Signed)
Subjective:   Patient ID: Maria Wilkins, female   DOB: 40 y.o.   MRN: [id number]   HPI Patient presents stating that she is doing well with her left heel here for stitch removal and here to discuss correction of her right heel which is scheduled for the next couple weeks   ROS      Objective:  Physical Exam  Neurovascular status intact negative Denna Haggard' sign noted patient's left heel is doing very well wound edges coapted and stitches intact and the right shows exquisite discomfort plantar heel at the insertional point tendon calcaneus     Assessment:  Doing well post endoscopic surgery left with chronic fasciitis right     Plan:  H&P reviewed left and recommended the continuation of conservative care with stitches removed today wound edges coapted well bandages applied and compression stocking.  For the right I went ahead discussed surgery allowed her to read consent form reviewed the complications and patient wants surgery scheduled for outpatient surgical procedure understanding all risk

## 2020-03-25 ENCOUNTER — Telehealth: Payer: Self-pay | Admitting: Podiatry

## 2020-03-25 NOTE — Telephone Encounter (Signed)
DOS: 04/01/2020  Procedure: Endoscopic Plantar Fasciotomy Rt (81017)  [place] [facility] Effective 07/10/2011 -  Deductible: $300 with $300 met and $0 remaining. Out of Network: $7,900 with $1,229.30 met and $6,670.70 remaining. CoInsurance: 20% Copay: $250 for [facility] l $500 for [facility] Providers  Per Salome Arnt, no Prior Authorization is required. Call Reference# [id number]

## 2020-03-29 ENCOUNTER — Encounter: Payer: Self-pay | Admitting: Podiatry

## 2020-03-31 ENCOUNTER — Other Ambulatory Visit: Payer: Self-pay | Admitting: Podiatry

## 2020-03-31 MED ORDER — HYDROCODONE-IBUPROFEN 5-200 MG PO TABS: 1.0000 | ORAL_TABLET | Freq: Three times a day (TID) | ORAL | 0 refills | Status: DC | PRN

## 2020-03-31 MED FILL — HYDROCOD-IBU 5-200 TAB: 5-200 | 6 days supply | Qty: 20 | Fill #0

## 2020-03-31 NOTE — Telephone Encounter (Signed)
done

## 2020-04-01 ENCOUNTER — Encounter: Payer: Self-pay | Admitting: Podiatry

## 2020-04-01 DIAGNOSIS — M722 Plantar fascial fibromatosis: Secondary | ICD-10-CM | POA: Diagnosis not present

## 2020-04-07 ENCOUNTER — Encounter: Payer: 59 | Admitting: Podiatry

## 2020-04-07 ENCOUNTER — Ambulatory Visit (INDEPENDENT_AMBULATORY_CARE_PROVIDER_SITE_OTHER): Payer: 59

## 2020-04-07 ENCOUNTER — Other Ambulatory Visit: Payer: Self-pay

## 2020-04-07 DIAGNOSIS — Z9889 Other specified postprocedural states: Secondary | ICD-10-CM

## 2020-04-07 NOTE — Progress Notes (Signed)
Patient present today for 1st post-op visit after Right EPF. Patient denies nausea, vomiting, fever, chills. Pain reported 0 out of 10. Only using Rx pain medication at night as needed. Using Ibuprofen throughout the day as needed. Patient advised that she is able to shower, continue to elevate right foot and wear ACE wrap. Patient currently wearing CAM boot and advised to gradually switch to surgical shoe. Follow-up in 2 weeks for suture removal. Patient verbalized understanding.

## 2020-04-21 ENCOUNTER — Other Ambulatory Visit: Payer: Self-pay

## 2020-04-21 ENCOUNTER — Encounter: Payer: Self-pay | Admitting: Podiatry

## 2020-04-21 ENCOUNTER — Other Ambulatory Visit: Payer: Self-pay | Admitting: Podiatry

## 2020-04-21 ENCOUNTER — Ambulatory Visit (INDEPENDENT_AMBULATORY_CARE_PROVIDER_SITE_OTHER): Payer: 59 | Admitting: Podiatry

## 2020-04-21 DIAGNOSIS — M722 Plantar fascial fibromatosis: Secondary | ICD-10-CM

## 2020-04-21 MED ORDER — DICLOFENAC SODIUM 75 MG PO TBEC: 75.0000 mg | DELAYED_RELEASE_TABLET | Freq: Two times a day (BID) | ORAL | 2 refills | Status: DC

## 2020-04-21 NOTE — Progress Notes (Signed)
Subjective:   Patient ID: Maria Wilkins, female   DOB: 40 y.o.   MRN: [id number]   HPI Patient is doing well stating that her feet ache but overall she does not get the sharp pain she used to   ROS      Objective:  Physical Exam  Neurovascular status intact with patient found to have significant diminishment of discomfort in the plantar heel pain still noted upon deep palpation     Assessment:  Doing well post endoscopic surgery right after having had the left one done     Plan:  H&P reviewed condition recommended stretching exercises heat ice therapy anti-inflammatories and placed on diclofenac 75 mg twice daily and reappoint to recheck

## 2020-04-30 ENCOUNTER — Other Ambulatory Visit (HOSPITAL_BASED_OUTPATIENT_CLINIC_OR_DEPARTMENT_OTHER): Payer: Self-pay

## 2020-05-15 ENCOUNTER — Other Ambulatory Visit (HOSPITAL_COMMUNITY): Payer: Self-pay

## 2020-05-15 MED ORDER — SCOPOLAMINE 1 MG/3DAYS TD PT72
MEDICATED_PATCH | TRANSDERMAL | 1 refills | Status: DC
  Filled 2020-05-15: qty 4, 12d supply, fill #0

## 2020-05-16 DIAGNOSIS — Z1159 Encounter for screening for other viral diseases: Secondary | ICD-10-CM | POA: Diagnosis not present

## 2020-05-27 ENCOUNTER — Other Ambulatory Visit: Payer: Self-pay

## 2020-05-27 ENCOUNTER — Other Ambulatory Visit (HOSPITAL_COMMUNITY): Payer: Self-pay

## 2020-05-27 MED FILL — Diclofenac Sodium Tab Delayed Release 75 MG: ORAL | 25 days supply | Qty: 50 | Fill #0 | Status: AC

## 2020-05-30 ENCOUNTER — Other Ambulatory Visit (HOSPITAL_COMMUNITY): Payer: Self-pay

## 2020-05-30 MED ORDER — ESCITALOPRAM OXALATE 20 MG PO TABS
ORAL_TABLET | ORAL | 0 refills | Status: DC
  Filled 2020-05-30: qty 90, 90d supply, fill #0

## 2020-06-05 ENCOUNTER — Other Ambulatory Visit (HOSPITAL_COMMUNITY): Payer: Self-pay

## 2020-06-12 ENCOUNTER — Ambulatory Visit: Payer: 59 | Admitting: Podiatry

## 2020-07-11 ENCOUNTER — Encounter: Payer: Self-pay | Admitting: Podiatry

## 2020-07-21 ENCOUNTER — Encounter: Payer: Self-pay | Admitting: Podiatry

## 2020-07-21 ENCOUNTER — Ambulatory Visit: Payer: 59 | Admitting: Podiatry

## 2020-07-21 ENCOUNTER — Other Ambulatory Visit: Payer: Self-pay

## 2020-07-21 DIAGNOSIS — M779 Enthesopathy, unspecified: Secondary | ICD-10-CM | POA: Diagnosis not present

## 2020-07-21 MED ORDER — TRIAMCINOLONE ACETONIDE 10 MG/ML IJ SUSP
10.0000 mg | Freq: Once | INTRAMUSCULAR | Status: AC
  Administered 2020-07-21: 10 mg

## 2020-07-22 NOTE — Progress Notes (Signed)
Subjective:   Patient ID: Maria Wilkins, female   DOB: 40 y.o.   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

## 2020-09-16 ENCOUNTER — Other Ambulatory Visit (HOSPITAL_COMMUNITY): Payer: Self-pay

## 2020-09-16 DIAGNOSIS — L720 Epidermal cyst: Secondary | ICD-10-CM | POA: Diagnosis not present

## 2020-09-16 DIAGNOSIS — L570 Actinic keratosis: Secondary | ICD-10-CM | POA: Diagnosis not present

## 2020-09-16 DIAGNOSIS — L718 Other rosacea: Secondary | ICD-10-CM | POA: Diagnosis not present

## 2020-09-16 DIAGNOSIS — L538 Other specified erythematous conditions: Secondary | ICD-10-CM | POA: Diagnosis not present

## 2020-09-16 DIAGNOSIS — B078 Other viral warts: Secondary | ICD-10-CM | POA: Diagnosis not present

## 2020-09-16 DIAGNOSIS — L710 Perioral dermatitis: Secondary | ICD-10-CM | POA: Diagnosis not present

## 2020-10-12 ENCOUNTER — Other Ambulatory Visit (HOSPITAL_COMMUNITY): Payer: Self-pay

## 2020-10-14 ENCOUNTER — Other Ambulatory Visit (HOSPITAL_COMMUNITY): Payer: Self-pay

## 2020-10-14 MED ORDER — ESCITALOPRAM OXALATE 20 MG PO TABS
ORAL_TABLET | ORAL | 1 refills | Status: DC
  Filled 2020-10-14: qty 90, 90d supply, fill #0
  Filled 2021-01-17: qty 90, 90d supply, fill #1

## 2020-11-17 ENCOUNTER — Encounter: Payer: Self-pay | Admitting: Podiatry

## 2020-11-17 ENCOUNTER — Ambulatory Visit (INDEPENDENT_AMBULATORY_CARE_PROVIDER_SITE_OTHER): Payer: 59 | Admitting: Podiatry

## 2020-11-17 ENCOUNTER — Other Ambulatory Visit: Payer: Self-pay

## 2020-11-17 DIAGNOSIS — M778 Other enthesopathies, not elsewhere classified: Secondary | ICD-10-CM | POA: Diagnosis not present

## 2020-11-17 MED ORDER — TRIAMCINOLONE ACETONIDE 10 MG/ML IJ SUSP
20.0000 mg | Freq: Once | INTRAMUSCULAR | Status: AC
  Administered 2020-11-17: 20 mg

## 2020-11-18 NOTE — Progress Notes (Signed)
Subjective:   Patient ID: Maria Wilkins, female   DOB: 40 y.o.   MRN: [id number]   HPI Patient presents stating that she has ongoing right foot pain and stated the injections were helpful but she has had reoccurrence of discomfort and it seems to have moved over a little bit.  States her heel over for the most part is been feeling good but she does have this continued forefoot pain   ROS      Objective:  Physical Exam  Neurovascular status intact with inflammation around the third and fourth MPJ right with fluid buildup around the joint surfaces and pain with palpation.  Patient does have good digital perfusion well oriented and only has mild plantar pain at the current time     Assessment:  Has had plantar fascial surgery is doing well with that but may still be walking differently during the healing.  With inflammation now around the third and fourth metatarsal phalangeal joint right     Plan:  H&P reviewed condition ever get a start her on her cam walker to try to rest the area and I did do an anesthetic block of the right forefoot I then aspirated the third and fourth MPJs getting out a small amount of clear fluid and injected each joint with 1/4 cc dexamethasone Kenalog and advised on rigid immobilization for the next few weeks.  May require other treatments if symptoms persist but hopefully this will solve the problem

## 2020-12-03 DIAGNOSIS — J019 Acute sinusitis, unspecified: Secondary | ICD-10-CM | POA: Diagnosis not present

## 2020-12-03 DIAGNOSIS — Z23 Encounter for immunization: Secondary | ICD-10-CM | POA: Diagnosis not present

## 2021-01-17 ENCOUNTER — Other Ambulatory Visit (HOSPITAL_COMMUNITY): Payer: Self-pay

## 2021-01-23 ENCOUNTER — Other Ambulatory Visit (HOSPITAL_COMMUNITY): Payer: Self-pay

## 2021-01-23 MED ORDER — PANTOPRAZOLE SODIUM 40 MG PO TBEC
DELAYED_RELEASE_TABLET | ORAL | 0 refills | Status: DC
  Filled 2021-01-23: qty 90, 90d supply, fill #0

## 2021-02-15 MED FILL — Diclofenac Sodium Tab Delayed Release 75 MG: ORAL | 25 days supply | Qty: 50 | Fill #1 | Status: AC

## 2021-02-16 ENCOUNTER — Other Ambulatory Visit (HOSPITAL_COMMUNITY): Payer: Self-pay

## 2021-02-16 ENCOUNTER — Encounter: Payer: Self-pay | Admitting: Podiatry

## 2021-02-16 NOTE — Telephone Encounter (Signed)
Please advise 

## 2021-02-19 ENCOUNTER — Ambulatory Visit (INDEPENDENT_AMBULATORY_CARE_PROVIDER_SITE_OTHER): Payer: 59

## 2021-02-19 ENCOUNTER — Other Ambulatory Visit (HOSPITAL_COMMUNITY): Payer: Self-pay

## 2021-02-19 ENCOUNTER — Ambulatory Visit: Payer: 59 | Admitting: Podiatry

## 2021-02-19 ENCOUNTER — Encounter: Payer: Self-pay | Admitting: Podiatry

## 2021-02-19 ENCOUNTER — Other Ambulatory Visit: Payer: Self-pay

## 2021-02-19 DIAGNOSIS — M779 Enthesopathy, unspecified: Secondary | ICD-10-CM

## 2021-02-19 DIAGNOSIS — M778 Other enthesopathies, not elsewhere classified: Secondary | ICD-10-CM

## 2021-02-19 MED ORDER — TRIAMCINOLONE ACETONIDE 10 MG/ML IJ SUSP
10.0000 mg | Freq: Once | INTRAMUSCULAR | Status: AC
  Administered 2021-02-19: 10 mg

## 2021-02-19 MED ORDER — GABAPENTIN 300 MG PO CAPS
300.0000 mg | ORAL_CAPSULE | Freq: Three times a day (TID) | ORAL | 3 refills | Status: DC
  Filled 2021-02-19: qty 90, 30d supply, fill #0
  Filled 2021-04-10: qty 90, 30d supply, fill #1

## 2021-02-19 NOTE — Progress Notes (Signed)
Subjective:   Patient ID: Maria Wilkins, female   DOB: 41 y.o.   MRN: [id number]   HPI Patient states she had several months of relief from pain but has had reoccurrence of pain right foot into the joints and into the top of the foot   ROS      Objective:  Physical Exam  Neurovascular status intact inflammation dorsal right foot around the extensor tendon complex distal portion along with into the third and fourth MPJ     Assessment:  Inflammatory condition with probable dorsal extensor tendinitis     Plan:  H&P reviewed condition did careful injection of the extensor complex 3 mg dexamethasone Kenalog placed on gabapentin to see if this will make a difference with the discomfort and discussed the previous plantar fascial surgery she had which is doing well.  Reappoint to recheck  X-rays do not indicate any changes in the metatarsal pattern or indications of arthritis stress fracture

## 2021-02-24 DIAGNOSIS — H6692 Otitis media, unspecified, left ear: Secondary | ICD-10-CM | POA: Diagnosis not present

## 2021-04-02 ENCOUNTER — Other Ambulatory Visit (HOSPITAL_COMMUNITY): Payer: Self-pay

## 2021-04-02 DIAGNOSIS — R1310 Dysphagia, unspecified: Secondary | ICD-10-CM | POA: Diagnosis not present

## 2021-04-02 DIAGNOSIS — Z1239 Encounter for other screening for malignant neoplasm of breast: Secondary | ICD-10-CM | POA: Diagnosis not present

## 2021-04-02 DIAGNOSIS — Z0000 Encounter for general adult medical examination without abnormal findings: Secondary | ICD-10-CM | POA: Diagnosis not present

## 2021-04-02 DIAGNOSIS — E669 Obesity, unspecified: Secondary | ICD-10-CM | POA: Diagnosis not present

## 2021-04-02 DIAGNOSIS — F322 Major depressive disorder, single episode, severe without psychotic features: Secondary | ICD-10-CM | POA: Diagnosis not present

## 2021-04-02 DIAGNOSIS — J45990 Exercise induced bronchospasm: Secondary | ICD-10-CM | POA: Diagnosis not present

## 2021-04-02 DIAGNOSIS — N393 Stress incontinence (female) (male): Secondary | ICD-10-CM | POA: Diagnosis not present

## 2021-04-02 MED ORDER — OZEMPIC (0.25 OR 0.5 MG/DOSE) 2 MG/1.5ML ~~LOC~~ SOPN
PEN_INJECTOR | SUBCUTANEOUS | 1 refills | Status: DC
  Filled 2021-04-02: qty 1.5, 28d supply, fill #0
  Filled 2021-04-17: qty 1.5, 56d supply, fill #0

## 2021-04-02 MED ORDER — ESCITALOPRAM OXALATE 20 MG PO TABS
ORAL_TABLET | ORAL | 1 refills | Status: DC
  Filled 2021-04-02: qty 90, 90d supply, fill #0
  Filled 2021-07-12: qty 90, 90d supply, fill #1

## 2021-04-06 ENCOUNTER — Other Ambulatory Visit (HOSPITAL_COMMUNITY): Payer: Self-pay

## 2021-04-06 MED ORDER — ATORVASTATIN CALCIUM 10 MG PO TABS
ORAL_TABLET | ORAL | 5 refills | Status: DC
  Filled 2021-04-06: qty 30, 30d supply, fill #0
  Filled 2021-05-05: qty 30, 30d supply, fill #1
  Filled 2021-06-15: qty 30, 30d supply, fill #2
  Filled 2021-07-12: qty 30, 30d supply, fill #3
  Filled 2021-08-09: qty 30, 30d supply, fill #4
  Filled 2021-10-18: qty 30, 30d supply, fill #5

## 2021-04-08 ENCOUNTER — Other Ambulatory Visit (HOSPITAL_COMMUNITY): Payer: Self-pay

## 2021-04-08 DIAGNOSIS — Z1231 Encounter for screening mammogram for malignant neoplasm of breast: Secondary | ICD-10-CM | POA: Diagnosis not present

## 2021-04-09 ENCOUNTER — Other Ambulatory Visit (HOSPITAL_COMMUNITY): Payer: Self-pay

## 2021-04-09 MED ORDER — WEGOVY 0.25 MG/0.5ML ~~LOC~~ SOAJ
SUBCUTANEOUS | 1 refills | Status: DC
  Filled 2021-04-09 – 2021-05-05 (×3): qty 2, 28d supply, fill #0
  Filled 2021-06-04: qty 2, 28d supply, fill #1

## 2021-04-10 ENCOUNTER — Other Ambulatory Visit (HOSPITAL_COMMUNITY): Payer: Self-pay

## 2021-04-13 ENCOUNTER — Other Ambulatory Visit (HOSPITAL_COMMUNITY): Payer: Self-pay

## 2021-04-15 ENCOUNTER — Other Ambulatory Visit (HOSPITAL_COMMUNITY): Payer: Self-pay

## 2021-04-16 ENCOUNTER — Encounter: Payer: Self-pay | Admitting: Podiatry

## 2021-04-16 ENCOUNTER — Other Ambulatory Visit: Payer: Self-pay

## 2021-04-16 ENCOUNTER — Ambulatory Visit: Payer: 59 | Admitting: Podiatry

## 2021-04-16 ENCOUNTER — Other Ambulatory Visit (HOSPITAL_COMMUNITY): Payer: Self-pay

## 2021-04-16 DIAGNOSIS — M778 Other enthesopathies, not elsewhere classified: Secondary | ICD-10-CM

## 2021-04-16 NOTE — Progress Notes (Signed)
Subjective:  ? ?Patient ID: Maria Wilkins, female   DOB: 41 y.o.   MRN: [id number]  ? ?HPI ?Patient continues to experience discomfort around the third and fourth metatarsal phalangeal joint right and admits that she should did not start physical therapy as her father passed away and she had to be out of town for over a month.  States those joints are still sore ? ? ?ROS ? ? ?   ?Objective:  ?Physical Exam  ?Neurovascular status intact with inflammation pain around the third and fourth MPJs right with continued irritation of the capsular surfaces.  More proximal seems to be doing okay but still mildly tender with the plantar fascia completely resolved ? ?   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 ?   ?Plan:  ?Discussed the importance of physical therapy and I have recommended padding therapy to take pressure off the joints and I want her to take Aleve 2 twice a day to try to reduce inflammation and we will see her back again in 6 weeks ultimately may require shortening osteotomy if symptoms persist ?   ? ? ?

## 2021-04-17 ENCOUNTER — Other Ambulatory Visit (HOSPITAL_COMMUNITY): Payer: Self-pay

## 2021-04-22 DIAGNOSIS — R2689 Other abnormalities of gait and mobility: Secondary | ICD-10-CM | POA: Diagnosis not present

## 2021-04-22 DIAGNOSIS — M79671 Pain in right foot: Secondary | ICD-10-CM | POA: Diagnosis not present

## 2021-04-22 DIAGNOSIS — M25674 Stiffness of right foot, not elsewhere classified: Secondary | ICD-10-CM | POA: Diagnosis not present

## 2021-04-22 DIAGNOSIS — R531 Weakness: Secondary | ICD-10-CM | POA: Diagnosis not present

## 2021-04-23 DIAGNOSIS — S0992XA Unspecified injury of nose, initial encounter: Secondary | ICD-10-CM | POA: Diagnosis not present

## 2021-04-23 DIAGNOSIS — R0683 Snoring: Secondary | ICD-10-CM | POA: Diagnosis not present

## 2021-04-23 DIAGNOSIS — G4719 Other hypersomnia: Secondary | ICD-10-CM | POA: Diagnosis not present

## 2021-04-23 DIAGNOSIS — Z820 Family history of epilepsy and other diseases of the nervous system: Secondary | ICD-10-CM | POA: Diagnosis not present

## 2021-04-23 DIAGNOSIS — G4730 Sleep apnea, unspecified: Secondary | ICD-10-CM | POA: Diagnosis not present

## 2021-04-23 DIAGNOSIS — Z6831 Body mass index (BMI) 31.0-31.9, adult: Secondary | ICD-10-CM | POA: Diagnosis not present

## 2021-04-27 DIAGNOSIS — R2689 Other abnormalities of gait and mobility: Secondary | ICD-10-CM | POA: Diagnosis not present

## 2021-04-27 DIAGNOSIS — M25674 Stiffness of right foot, not elsewhere classified: Secondary | ICD-10-CM | POA: Diagnosis not present

## 2021-04-27 DIAGNOSIS — R531 Weakness: Secondary | ICD-10-CM | POA: Diagnosis not present

## 2021-04-27 DIAGNOSIS — M79671 Pain in right foot: Secondary | ICD-10-CM | POA: Diagnosis not present

## 2021-04-29 DIAGNOSIS — L710 Perioral dermatitis: Secondary | ICD-10-CM | POA: Diagnosis not present

## 2021-04-29 DIAGNOSIS — Z7189 Other specified counseling: Secondary | ICD-10-CM | POA: Diagnosis not present

## 2021-04-29 DIAGNOSIS — L718 Other rosacea: Secondary | ICD-10-CM | POA: Diagnosis not present

## 2021-04-29 DIAGNOSIS — R1310 Dysphagia, unspecified: Secondary | ICD-10-CM | POA: Diagnosis not present

## 2021-04-29 DIAGNOSIS — L720 Epidermal cyst: Secondary | ICD-10-CM | POA: Diagnosis not present

## 2021-04-30 DIAGNOSIS — M79671 Pain in right foot: Secondary | ICD-10-CM | POA: Diagnosis not present

## 2021-04-30 DIAGNOSIS — R531 Weakness: Secondary | ICD-10-CM | POA: Diagnosis not present

## 2021-04-30 DIAGNOSIS — M25674 Stiffness of right foot, not elsewhere classified: Secondary | ICD-10-CM | POA: Diagnosis not present

## 2021-04-30 DIAGNOSIS — R2689 Other abnormalities of gait and mobility: Secondary | ICD-10-CM | POA: Diagnosis not present

## 2021-05-04 DIAGNOSIS — M25674 Stiffness of right foot, not elsewhere classified: Secondary | ICD-10-CM | POA: Diagnosis not present

## 2021-05-04 DIAGNOSIS — R531 Weakness: Secondary | ICD-10-CM | POA: Diagnosis not present

## 2021-05-04 DIAGNOSIS — R2689 Other abnormalities of gait and mobility: Secondary | ICD-10-CM | POA: Diagnosis not present

## 2021-05-04 DIAGNOSIS — M79671 Pain in right foot: Secondary | ICD-10-CM | POA: Diagnosis not present

## 2021-05-05 ENCOUNTER — Other Ambulatory Visit (HOSPITAL_COMMUNITY): Payer: Self-pay

## 2021-05-06 ENCOUNTER — Other Ambulatory Visit (HOSPITAL_COMMUNITY): Payer: Self-pay

## 2021-05-06 MED ORDER — PANTOPRAZOLE SODIUM 40 MG PO TBEC
DELAYED_RELEASE_TABLET | ORAL | 1 refills | Status: DC
  Filled 2021-05-06: qty 90, 90d supply, fill #0

## 2021-05-07 DIAGNOSIS — R531 Weakness: Secondary | ICD-10-CM | POA: Diagnosis not present

## 2021-05-07 DIAGNOSIS — R2689 Other abnormalities of gait and mobility: Secondary | ICD-10-CM | POA: Diagnosis not present

## 2021-05-07 DIAGNOSIS — M79671 Pain in right foot: Secondary | ICD-10-CM | POA: Diagnosis not present

## 2021-05-07 DIAGNOSIS — M25674 Stiffness of right foot, not elsewhere classified: Secondary | ICD-10-CM | POA: Diagnosis not present

## 2021-05-13 DIAGNOSIS — M79671 Pain in right foot: Secondary | ICD-10-CM | POA: Diagnosis not present

## 2021-05-13 DIAGNOSIS — M25674 Stiffness of right foot, not elsewhere classified: Secondary | ICD-10-CM | POA: Diagnosis not present

## 2021-05-13 DIAGNOSIS — R2689 Other abnormalities of gait and mobility: Secondary | ICD-10-CM | POA: Diagnosis not present

## 2021-05-13 DIAGNOSIS — R531 Weakness: Secondary | ICD-10-CM | POA: Diagnosis not present

## 2021-05-18 ENCOUNTER — Ambulatory Visit: Payer: 59 | Admitting: Podiatry

## 2021-05-18 ENCOUNTER — Encounter: Payer: Self-pay | Admitting: Podiatry

## 2021-05-18 ENCOUNTER — Ambulatory Visit (INDEPENDENT_AMBULATORY_CARE_PROVIDER_SITE_OTHER): Payer: 59

## 2021-05-18 DIAGNOSIS — N393 Stress incontinence (female) (male): Secondary | ICD-10-CM | POA: Diagnosis not present

## 2021-05-18 DIAGNOSIS — M778 Other enthesopathies, not elsewhere classified: Secondary | ICD-10-CM

## 2021-05-18 NOTE — Progress Notes (Signed)
Subjective:  ? ?Patient ID: Maria Wilkins, female   DOB: 41 y.o.   MRN: [id number]  ? ?HPI ?Patient presents stating she continues to have significant persistent pain in her forefoot right and states that the injections are only temporary and she continues to have problems with bearing weight on her foot ? ? ?ROS ? ? ?   ?Objective:  ?Physical Exam  ?Neurovascular status intact patient's heels are doing excellent with chronic inflammation of the third and fourth metatarsal phalangeal joint which has not resolved with conservative treatments over the last approximate year ? ?   ?Assessment:  ?Inflammatory capsulitis third and fourth MPJs that has failed to respond to conservative treatment ? ?   ?Plan:  ?H&P reviewed condition at great length and I do think given the long-term nature of this failure to respond to numerous conservative treatments that shortening osteotomies of the third and fourth right would be her best long-term opportunity to solve this problem.  I allowed her to read the consent form going over all possible complications and reviewed with her the surgery and the fact that alternative treatments complications exist and that there is no long-term guarantees this will solve the problem.  She is willing to undergo this she understands everything that we discussed today and after extensive review she signed consent form and is scheduled for outpatient surgery.  She will bring her boot in with her when she comes in for procedure and is encouraged to call with questions concerns. ? ?X-rays indicate there is not been any change in the bone structure no indication of fracture or no indications of other bone pathology ?   ? ? ?

## 2021-05-20 ENCOUNTER — Telehealth: Payer: Self-pay

## 2021-05-20 NOTE — Telephone Encounter (Signed)
DOS 06/09/2021 ? ?METATARSAL OSTEOTOMY 3,4 RT - 28308 ? ?[facility] EFFECTIVE DATE - 02/08/2021 ? ?PLAN DEDUCTIBLE - $350.00 W/ $0.00 REMAINING ?OUT OF POCKET - $7900.00 W/ $6213.08 REMAINING ?COPAY $0.00 ?COINSURANCE - 60% ? ?SPOKE TO NATALIE AT [facility], SHE STATED NO AUTH REQUIRED FOR CPT [phone]. CALL REF # [id number] ? ?

## 2021-05-25 ENCOUNTER — Other Ambulatory Visit (HOSPITAL_COMMUNITY): Payer: Self-pay

## 2021-05-25 DIAGNOSIS — R1310 Dysphagia, unspecified: Secondary | ICD-10-CM | POA: Diagnosis not present

## 2021-05-25 MED ORDER — OMEPRAZOLE 40 MG PO CPDR
DELAYED_RELEASE_CAPSULE | ORAL | 5 refills | Status: DC
  Filled 2021-05-25: qty 60, 30d supply, fill #0
  Filled 2021-07-12: qty 60, 30d supply, fill #1
  Filled 2021-08-09: qty 60, 30d supply, fill #2
  Filled 2022-01-11: qty 60, 30d supply, fill #3

## 2021-05-27 DIAGNOSIS — M7751 Other enthesopathy of right foot: Secondary | ICD-10-CM | POA: Diagnosis not present

## 2021-05-27 DIAGNOSIS — S6991XA Unspecified injury of right wrist, hand and finger(s), initial encounter: Secondary | ICD-10-CM | POA: Diagnosis not present

## 2021-05-27 DIAGNOSIS — M7741 Metatarsalgia, right foot: Secondary | ICD-10-CM | POA: Diagnosis not present

## 2021-05-27 DIAGNOSIS — G5791 Unspecified mononeuropathy of right lower limb: Secondary | ICD-10-CM | POA: Diagnosis not present

## 2021-05-27 DIAGNOSIS — M25531 Pain in right wrist: Secondary | ICD-10-CM | POA: Diagnosis not present

## 2021-05-27 DIAGNOSIS — M79641 Pain in right hand: Secondary | ICD-10-CM | POA: Diagnosis not present

## 2021-05-27 DIAGNOSIS — M79671 Pain in right foot: Secondary | ICD-10-CM | POA: Diagnosis not present

## 2021-06-04 ENCOUNTER — Other Ambulatory Visit (HOSPITAL_COMMUNITY): Payer: Self-pay

## 2021-06-04 ENCOUNTER — Telehealth: Payer: Self-pay

## 2021-06-04 NOTE — Telephone Encounter (Signed)
Latera called to cancel her surgery with Dr. Charlsie Merles on 06/09/2021. She stated she got a 2nd opinion and would like to hold off on surgery. Notified Dr. Charlsie Merles and Aram Beecham with [facility] ?

## 2021-06-05 ENCOUNTER — Other Ambulatory Visit (HOSPITAL_COMMUNITY): Payer: Self-pay

## 2021-06-05 MED ORDER — WEGOVY 0.5 MG/0.5ML ~~LOC~~ SOAJ
0.5000 mg | SUBCUTANEOUS | 1 refills | Status: DC
  Filled 2021-06-05: qty 2, 28d supply, fill #0

## 2021-06-05 MED ORDER — ONDANSETRON HCL 4 MG PO TABS
ORAL_TABLET | ORAL | 0 refills | Status: DC
  Filled 2021-06-05: qty 20, 7d supply, fill #0

## 2021-06-05 NOTE — Telephone Encounter (Signed)
ok 

## 2021-06-08 ENCOUNTER — Other Ambulatory Visit (HOSPITAL_COMMUNITY): Payer: Self-pay

## 2021-06-15 ENCOUNTER — Other Ambulatory Visit (HOSPITAL_COMMUNITY): Payer: Self-pay

## 2021-06-15 ENCOUNTER — Encounter: Payer: 59 | Admitting: Podiatry

## 2021-06-29 ENCOUNTER — Other Ambulatory Visit (HOSPITAL_COMMUNITY): Payer: Self-pay

## 2021-06-29 DIAGNOSIS — F322 Major depressive disorder, single episode, severe without psychotic features: Secondary | ICD-10-CM | POA: Diagnosis not present

## 2021-06-29 DIAGNOSIS — E663 Overweight: Secondary | ICD-10-CM | POA: Diagnosis not present

## 2021-06-29 DIAGNOSIS — R1310 Dysphagia, unspecified: Secondary | ICD-10-CM | POA: Diagnosis not present

## 2021-06-29 MED ORDER — WEGOVY 0.5 MG/0.5ML ~~LOC~~ SOAJ
0.5000 mg | SUBCUTANEOUS | 1 refills | Status: DC
  Filled 2021-06-29: qty 2, 28d supply, fill #0

## 2021-07-01 ENCOUNTER — Other Ambulatory Visit (HOSPITAL_COMMUNITY): Payer: Self-pay

## 2021-07-13 ENCOUNTER — Other Ambulatory Visit (HOSPITAL_COMMUNITY): Payer: Self-pay

## 2021-07-21 DIAGNOSIS — G4733 Obstructive sleep apnea (adult) (pediatric): Secondary | ICD-10-CM | POA: Diagnosis not present

## 2021-07-24 DIAGNOSIS — M7741 Metatarsalgia, right foot: Secondary | ICD-10-CM | POA: Diagnosis not present

## 2021-07-24 DIAGNOSIS — G5791 Unspecified mononeuropathy of right lower limb: Secondary | ICD-10-CM | POA: Diagnosis not present

## 2021-07-24 DIAGNOSIS — M7751 Other enthesopathy of right foot: Secondary | ICD-10-CM | POA: Diagnosis not present

## 2021-07-27 ENCOUNTER — Other Ambulatory Visit (HOSPITAL_COMMUNITY): Payer: Self-pay

## 2021-08-10 ENCOUNTER — Other Ambulatory Visit (HOSPITAL_COMMUNITY): Payer: Self-pay

## 2021-08-17 ENCOUNTER — Other Ambulatory Visit (HOSPITAL_COMMUNITY): Payer: Self-pay

## 2021-08-17 MED ORDER — ONDANSETRON HCL 4 MG PO TABS
ORAL_TABLET | ORAL | 0 refills | Status: DC
  Filled 2021-08-17: qty 20, 7d supply, fill #0

## 2021-08-17 MED ORDER — WEGOVY 1 MG/0.5ML ~~LOC~~ SOAJ
1.0000 mg | SUBCUTANEOUS | 0 refills | Status: DC
  Filled 2021-08-17: qty 2, 28d supply, fill #0

## 2021-08-17 MED ORDER — WEGOVY 0.5 MG/0.5ML ~~LOC~~ SOAJ
0.5000 mg | SUBCUTANEOUS | 1 refills | Status: DC
  Filled 2021-08-17: qty 2, 28d supply, fill #0

## 2021-08-19 ENCOUNTER — Other Ambulatory Visit (HOSPITAL_COMMUNITY): Payer: Self-pay

## 2021-08-25 ENCOUNTER — Other Ambulatory Visit (HOSPITAL_COMMUNITY): Payer: Self-pay

## 2021-08-25 DIAGNOSIS — G4733 Obstructive sleep apnea (adult) (pediatric): Secondary | ICD-10-CM | POA: Diagnosis not present

## 2021-08-25 DIAGNOSIS — G4710 Hypersomnia, unspecified: Secondary | ICD-10-CM | POA: Diagnosis not present

## 2021-08-28 ENCOUNTER — Other Ambulatory Visit (HOSPITAL_COMMUNITY): Payer: Self-pay

## 2021-08-28 MED ORDER — WEGOVY 1.7 MG/0.75ML ~~LOC~~ SOAJ
SUBCUTANEOUS | 0 refills | Status: DC
Start: 2021-08-28 — End: 2021-10-09
  Filled 2021-08-28 – 2021-09-11 (×2): qty 3, 28d supply, fill #0

## 2021-09-01 DIAGNOSIS — G4733 Obstructive sleep apnea (adult) (pediatric): Secondary | ICD-10-CM | POA: Diagnosis not present

## 2021-09-04 ENCOUNTER — Other Ambulatory Visit (HOSPITAL_COMMUNITY): Payer: Self-pay

## 2021-09-08 DIAGNOSIS — R238 Other skin changes: Secondary | ICD-10-CM | POA: Diagnosis not present

## 2021-09-08 DIAGNOSIS — L710 Perioral dermatitis: Secondary | ICD-10-CM | POA: Diagnosis not present

## 2021-09-08 DIAGNOSIS — B078 Other viral warts: Secondary | ICD-10-CM | POA: Diagnosis not present

## 2021-09-08 DIAGNOSIS — L738 Other specified follicular disorders: Secondary | ICD-10-CM | POA: Diagnosis not present

## 2021-09-08 DIAGNOSIS — L0889 Other specified local infections of the skin and subcutaneous tissue: Secondary | ICD-10-CM | POA: Diagnosis not present

## 2021-09-08 DIAGNOSIS — L718 Other rosacea: Secondary | ICD-10-CM | POA: Diagnosis not present

## 2021-09-09 ENCOUNTER — Other Ambulatory Visit (HOSPITAL_COMMUNITY): Payer: Self-pay

## 2021-09-09 DIAGNOSIS — G4733 Obstructive sleep apnea (adult) (pediatric): Secondary | ICD-10-CM | POA: Diagnosis not present

## 2021-09-11 ENCOUNTER — Other Ambulatory Visit (HOSPITAL_COMMUNITY): Payer: Self-pay

## 2021-10-02 DIAGNOSIS — G4733 Obstructive sleep apnea (adult) (pediatric): Secondary | ICD-10-CM | POA: Diagnosis not present

## 2021-10-08 ENCOUNTER — Other Ambulatory Visit (HOSPITAL_COMMUNITY): Payer: Self-pay

## 2021-10-09 ENCOUNTER — Other Ambulatory Visit (HOSPITAL_COMMUNITY): Payer: Self-pay

## 2021-10-09 MED ORDER — WEGOVY 1.7 MG/0.75ML ~~LOC~~ SOAJ
SUBCUTANEOUS | 0 refills | Status: DC
  Filled 2021-10-09: qty 3, 28d supply, fill #0

## 2021-10-15 ENCOUNTER — Other Ambulatory Visit (HOSPITAL_COMMUNITY): Payer: Self-pay

## 2021-10-18 ENCOUNTER — Other Ambulatory Visit (HOSPITAL_COMMUNITY): Payer: Self-pay

## 2021-10-19 ENCOUNTER — Other Ambulatory Visit (HOSPITAL_COMMUNITY): Payer: Self-pay

## 2021-10-19 MED ORDER — ONDANSETRON HCL 4 MG PO TABS
4.0000 mg | ORAL_TABLET | Freq: Three times a day (TID) | ORAL | 0 refills | Status: DC | PRN
  Filled 2021-10-19: qty 20, 7d supply, fill #0

## 2021-10-19 MED ORDER — ESCITALOPRAM OXALATE 20 MG PO TABS
20.0000 mg | ORAL_TABLET | Freq: Every day | ORAL | 1 refills | Status: DC
  Filled 2021-10-19: qty 90, 90d supply, fill #0
  Filled 2022-02-23: qty 90, 90d supply, fill #1

## 2021-11-02 DIAGNOSIS — G4733 Obstructive sleep apnea (adult) (pediatric): Secondary | ICD-10-CM | POA: Diagnosis not present

## 2021-11-06 ENCOUNTER — Other Ambulatory Visit (HOSPITAL_COMMUNITY): Payer: Self-pay

## 2021-11-06 MED ORDER — ONDANSETRON HCL 4 MG PO TABS
4.0000 mg | ORAL_TABLET | Freq: Three times a day (TID) | ORAL | 0 refills | Status: DC | PRN
  Filled 2021-11-06: qty 20, 7d supply, fill #0

## 2021-11-06 MED ORDER — WEGOVY 1.7 MG/0.75ML ~~LOC~~ SOAJ
1.7000 mg | SUBCUTANEOUS | 0 refills | Status: DC
Start: 2021-11-06 — End: 2021-12-21
  Filled 2021-11-06: qty 3, 28d supply, fill #0

## 2021-11-09 ENCOUNTER — Other Ambulatory Visit (HOSPITAL_COMMUNITY): Payer: Self-pay

## 2021-11-09 MED ORDER — OMEPRAZOLE 40 MG PO CPDR
40.0000 mg | DELAYED_RELEASE_CAPSULE | Freq: Two times a day (BID) | ORAL | 5 refills | Status: DC
  Filled 2021-11-09: qty 60, 30d supply, fill #0

## 2021-11-11 ENCOUNTER — Other Ambulatory Visit (HOSPITAL_COMMUNITY): Payer: Self-pay

## 2021-11-19 ENCOUNTER — Other Ambulatory Visit (HOSPITAL_COMMUNITY): Payer: Self-pay

## 2021-11-19 MED ORDER — ATORVASTATIN CALCIUM 10 MG PO TABS
10.0000 mg | ORAL_TABLET | Freq: Every day | ORAL | 0 refills | Status: DC
  Filled 2021-11-19: qty 90, 90d supply, fill #0

## 2021-11-26 DIAGNOSIS — G4733 Obstructive sleep apnea (adult) (pediatric): Secondary | ICD-10-CM | POA: Diagnosis not present

## 2021-11-26 DIAGNOSIS — G4726 Circadian rhythm sleep disorder, shift work type: Secondary | ICD-10-CM | POA: Diagnosis not present

## 2021-12-02 DIAGNOSIS — G4733 Obstructive sleep apnea (adult) (pediatric): Secondary | ICD-10-CM | POA: Diagnosis not present

## 2021-12-08 DIAGNOSIS — G4733 Obstructive sleep apnea (adult) (pediatric): Secondary | ICD-10-CM | POA: Diagnosis not present

## 2021-12-21 ENCOUNTER — Other Ambulatory Visit (HOSPITAL_COMMUNITY): Payer: Self-pay

## 2021-12-21 MED ORDER — WEGOVY 1.7 MG/0.75ML ~~LOC~~ SOAJ
1.7000 mg | SUBCUTANEOUS | 0 refills | Status: DC
Start: 2021-12-21 — End: 2022-01-26
  Filled 2021-12-21: qty 3, 28d supply, fill #0

## 2021-12-22 ENCOUNTER — Other Ambulatory Visit (HOSPITAL_COMMUNITY): Payer: Self-pay

## 2021-12-23 ENCOUNTER — Other Ambulatory Visit: Payer: Self-pay

## 2021-12-23 ENCOUNTER — Other Ambulatory Visit (HOSPITAL_COMMUNITY): Payer: Self-pay

## 2022-01-08 ENCOUNTER — Other Ambulatory Visit (HOSPITAL_COMMUNITY): Payer: Self-pay

## 2022-01-08 MED ORDER — OMEPRAZOLE 40 MG PO CPDR
40.0000 mg | DELAYED_RELEASE_CAPSULE | Freq: Two times a day (BID) | ORAL | 5 refills | Status: DC
  Filled 2022-01-08: qty 60, 30d supply, fill #0
  Filled 2022-02-23: qty 60, 30d supply, fill #1
  Filled 2022-03-22: qty 60, 30d supply, fill #2
  Filled 2022-05-09: qty 60, 30d supply, fill #3
  Filled 2022-06-04: qty 60, 30d supply, fill #4
  Filled 2022-07-04: qty 60, 30d supply, fill #5

## 2022-01-11 ENCOUNTER — Other Ambulatory Visit (HOSPITAL_COMMUNITY): Payer: Self-pay

## 2022-01-26 ENCOUNTER — Other Ambulatory Visit (HOSPITAL_COMMUNITY): Payer: Self-pay

## 2022-01-26 ENCOUNTER — Other Ambulatory Visit: Payer: Self-pay

## 2022-01-26 MED ORDER — ONDANSETRON HCL 4 MG PO TABS
4.0000 mg | ORAL_TABLET | Freq: Three times a day (TID) | ORAL | 0 refills | Status: DC | PRN
  Filled 2022-01-26: qty 20, 7d supply, fill #0

## 2022-01-26 MED ORDER — WEGOVY 1.7 MG/0.75ML ~~LOC~~ SOAJ
1.7000 mg | SUBCUTANEOUS | 0 refills | Status: DC
Start: 2022-01-25 — End: 2022-03-25
  Filled 2022-01-26: qty 3, 28d supply, fill #0

## 2022-01-28 ENCOUNTER — Encounter (HOSPITAL_COMMUNITY): Payer: Self-pay

## 2022-01-28 ENCOUNTER — Other Ambulatory Visit: Payer: Self-pay

## 2022-01-28 ENCOUNTER — Other Ambulatory Visit (HOSPITAL_COMMUNITY): Payer: Self-pay

## 2022-02-04 ENCOUNTER — Other Ambulatory Visit (HOSPITAL_COMMUNITY): Payer: Self-pay

## 2022-02-04 ENCOUNTER — Other Ambulatory Visit: Payer: Self-pay

## 2022-02-23 ENCOUNTER — Other Ambulatory Visit: Payer: Self-pay

## 2022-02-23 ENCOUNTER — Other Ambulatory Visit (HOSPITAL_COMMUNITY): Payer: Self-pay

## 2022-02-24 ENCOUNTER — Other Ambulatory Visit (HOSPITAL_COMMUNITY): Payer: Self-pay

## 2022-02-24 ENCOUNTER — Encounter (HOSPITAL_COMMUNITY): Payer: Self-pay

## 2022-02-24 MED ORDER — ATORVASTATIN CALCIUM 10 MG PO TABS
10.0000 mg | ORAL_TABLET | Freq: Every day | ORAL | 0 refills | Status: DC
  Filled 2022-02-24 – 2022-03-12 (×2): qty 90, 90d supply, fill #0

## 2022-03-02 ENCOUNTER — Other Ambulatory Visit: Payer: Self-pay

## 2022-03-04 ENCOUNTER — Other Ambulatory Visit (HOSPITAL_COMMUNITY): Payer: Self-pay

## 2022-03-12 ENCOUNTER — Other Ambulatory Visit (HOSPITAL_COMMUNITY): Payer: Self-pay

## 2022-03-15 DIAGNOSIS — G4733 Obstructive sleep apnea (adult) (pediatric): Secondary | ICD-10-CM | POA: Diagnosis not present

## 2022-03-22 ENCOUNTER — Other Ambulatory Visit (HOSPITAL_COMMUNITY): Payer: Self-pay

## 2022-03-25 ENCOUNTER — Other Ambulatory Visit (HOSPITAL_COMMUNITY): Payer: Self-pay

## 2022-03-25 MED ORDER — WEGOVY 1.7 MG/0.75ML ~~LOC~~ SOAJ
1.7000 mg | SUBCUTANEOUS | 0 refills | Status: DC
  Filled 2022-03-25: qty 3, 28d supply, fill #0

## 2022-04-05 ENCOUNTER — Other Ambulatory Visit (HOSPITAL_COMMUNITY): Payer: Self-pay

## 2022-04-08 DIAGNOSIS — J0100 Acute maxillary sinusitis, unspecified: Secondary | ICD-10-CM | POA: Diagnosis not present

## 2022-04-09 ENCOUNTER — Other Ambulatory Visit (HOSPITAL_COMMUNITY): Payer: Self-pay

## 2022-04-09 MED ORDER — AZITHROMYCIN 250 MG PO TABS
ORAL_TABLET | ORAL | 0 refills | Status: DC
  Filled 2022-04-09: qty 6, 5d supply, fill #0

## 2022-04-13 DIAGNOSIS — G4733 Obstructive sleep apnea (adult) (pediatric): Secondary | ICD-10-CM | POA: Diagnosis not present

## 2022-05-10 ENCOUNTER — Other Ambulatory Visit: Payer: Self-pay

## 2022-05-14 DIAGNOSIS — G4733 Obstructive sleep apnea (adult) (pediatric): Secondary | ICD-10-CM | POA: Diagnosis not present

## 2022-05-17 ENCOUNTER — Other Ambulatory Visit (HOSPITAL_COMMUNITY): Payer: Self-pay

## 2022-06-04 ENCOUNTER — Other Ambulatory Visit (HOSPITAL_COMMUNITY): Payer: Self-pay

## 2022-06-04 ENCOUNTER — Other Ambulatory Visit: Payer: Self-pay

## 2022-06-04 MED ORDER — ESCITALOPRAM OXALATE 20 MG PO TABS
20.0000 mg | ORAL_TABLET | Freq: Every day | ORAL | 0 refills | Status: DC
  Filled 2022-06-04 (×2): qty 90, 90d supply, fill #0

## 2022-06-04 MED ORDER — ATORVASTATIN CALCIUM 10 MG PO TABS
10.0000 mg | ORAL_TABLET | Freq: Every day | ORAL | 0 refills | Status: DC
  Filled 2022-06-04: qty 90, 90d supply, fill #0

## 2022-06-11 DIAGNOSIS — R92323 Mammographic fibroglandular density, bilateral breasts: Secondary | ICD-10-CM | POA: Diagnosis not present

## 2022-06-11 DIAGNOSIS — Z1231 Encounter for screening mammogram for malignant neoplasm of breast: Secondary | ICD-10-CM | POA: Diagnosis not present

## 2022-06-29 DIAGNOSIS — G4733 Obstructive sleep apnea (adult) (pediatric): Secondary | ICD-10-CM | POA: Diagnosis not present

## 2022-07-06 ENCOUNTER — Other Ambulatory Visit: Payer: Self-pay

## 2022-07-30 DIAGNOSIS — B3731 Acute candidiasis of vulva and vagina: Secondary | ICD-10-CM | POA: Diagnosis not present

## 2022-07-30 DIAGNOSIS — G4733 Obstructive sleep apnea (adult) (pediatric): Secondary | ICD-10-CM | POA: Diagnosis not present

## 2022-08-01 ENCOUNTER — Other Ambulatory Visit (HOSPITAL_COMMUNITY): Payer: Self-pay

## 2022-08-02 ENCOUNTER — Other Ambulatory Visit (HOSPITAL_COMMUNITY): Payer: Self-pay

## 2022-08-04 ENCOUNTER — Other Ambulatory Visit (HOSPITAL_COMMUNITY): Payer: Self-pay

## 2022-08-05 ENCOUNTER — Other Ambulatory Visit (HOSPITAL_COMMUNITY): Payer: Self-pay

## 2022-08-11 ENCOUNTER — Other Ambulatory Visit (HOSPITAL_COMMUNITY): Payer: Self-pay

## 2022-08-18 ENCOUNTER — Other Ambulatory Visit: Payer: Self-pay | Admitting: Oncology

## 2022-08-18 DIAGNOSIS — Z006 Encounter for examination for normal comparison and control in clinical research program: Secondary | ICD-10-CM

## 2022-08-27 ENCOUNTER — Other Ambulatory Visit (HOSPITAL_COMMUNITY): Payer: Self-pay

## 2022-08-29 DIAGNOSIS — G4733 Obstructive sleep apnea (adult) (pediatric): Secondary | ICD-10-CM | POA: Diagnosis not present

## 2022-08-30 ENCOUNTER — Other Ambulatory Visit (HOSPITAL_COMMUNITY): Payer: Self-pay

## 2022-08-30 MED ORDER — ESCITALOPRAM OXALATE 20 MG PO TABS
20.0000 mg | ORAL_TABLET | Freq: Every day | ORAL | 0 refills | Status: DC
  Filled 2022-08-30: qty 90, 90d supply, fill #0

## 2022-09-01 ENCOUNTER — Other Ambulatory Visit (HOSPITAL_COMMUNITY): Payer: Self-pay

## 2022-09-01 MED ORDER — ATORVASTATIN CALCIUM 10 MG PO TABS
10.0000 mg | ORAL_TABLET | Freq: Every day | ORAL | 0 refills | Status: DC
  Filled 2022-09-01: qty 90, 90d supply, fill #0

## 2022-09-07 ENCOUNTER — Other Ambulatory Visit (HOSPITAL_COMMUNITY): Payer: Self-pay

## 2022-09-07 MED ORDER — OMEPRAZOLE 40 MG PO CPDR
40.0000 mg | DELAYED_RELEASE_CAPSULE | Freq: Two times a day (BID) | ORAL | 5 refills | Status: AC
  Filled 2022-09-07: qty 60, 30d supply, fill #0
  Filled 2022-10-09: qty 60, 30d supply, fill #1
  Filled 2022-11-04: qty 60, 30d supply, fill #2
  Filled 2022-12-20: qty 60, 30d supply, fill #3
  Filled 2023-01-24: qty 60, 30d supply, fill #4
  Filled 2023-05-19: qty 60, 30d supply, fill #5

## 2022-09-27 ENCOUNTER — Ambulatory Visit: Payer: Commercial Managed Care - PPO | Admitting: Orthopedic Surgery

## 2022-09-27 DIAGNOSIS — G4733 Obstructive sleep apnea (adult) (pediatric): Secondary | ICD-10-CM | POA: Diagnosis not present

## 2022-10-09 ENCOUNTER — Other Ambulatory Visit (HOSPITAL_COMMUNITY): Payer: Self-pay

## 2022-10-12 DIAGNOSIS — M79644 Pain in right finger(s): Secondary | ICD-10-CM | POA: Diagnosis not present

## 2022-10-25 ENCOUNTER — Ambulatory Visit: Payer: 59 | Admitting: Orthopedic Surgery

## 2022-10-28 DIAGNOSIS — G4733 Obstructive sleep apnea (adult) (pediatric): Secondary | ICD-10-CM | POA: Diagnosis not present

## 2022-11-02 ENCOUNTER — Ambulatory Visit (INDEPENDENT_AMBULATORY_CARE_PROVIDER_SITE_OTHER): Payer: 59 | Admitting: Orthopaedic Surgery

## 2022-11-02 ENCOUNTER — Other Ambulatory Visit (INDEPENDENT_AMBULATORY_CARE_PROVIDER_SITE_OTHER): Payer: Self-pay

## 2022-11-02 ENCOUNTER — Ambulatory Visit: Payer: 59 | Admitting: Orthopaedic Surgery

## 2022-11-02 ENCOUNTER — Encounter: Payer: Self-pay | Admitting: Orthopaedic Surgery

## 2022-11-02 DIAGNOSIS — M79641 Pain in right hand: Secondary | ICD-10-CM

## 2022-11-02 MED ORDER — MELOXICAM 7.5 MG PO TABS: 7.5000 mg | ORAL_TABLET | Freq: Two times a day (BID) | ORAL | 2 refills | Status: DC | PRN

## 2022-11-02 NOTE — Progress Notes (Signed)
Office Visit Note   Patient: Maria Wilkins           Date of Birth: 1980-03-29           MRN: [id number] Visit Date: 11/02/2022              Requested by: Arlie Solomons, MD [address] [place],  [place] [address] PCP: Arlie Solomons, MD   Assessment & Plan: Visit Diagnoses:  1. Pain in right hand     Plan: Maria Wilkins is a 42 year old female with discomfort in the third MCP region.  Possible conditions discussed with the patient.  Could be early tenosynovitis and trigger finger or lumbrical triggering or osteoarthritis flare.  Recommend a period of meloxicam and relative rest and activity modification.  Recommend night splint.  She will follow-up if symptoms do not improve.  Follow-Up Instructions: Return if symptoms worsen or fail to improve.   Orders:  Orders Placed This Encounter  Procedures   XR Hand Complete Right   Meds ordered this encounter  Medications   meloxicam (MOBIC) 7.5 MG tablet    Sig: Take 1 tablet (7.5 mg total) by mouth 2 (two) times daily as needed for pain.    Dispense:  30 tablet    Refill:  2      Procedures: No procedures performed   Clinical Data: No additional findings.   Subjective: Chief Complaint  Patient presents with   Right Hand - Pain    Middle Finger    Patient is presenting with some right middle finger pain.  Patient states that the pain started approximately 1 month ago.  Patient does not remember any inciting event.  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  Patient has no pain when she presses over the area but mainly has pain whenever she uses the area.  Patient otherwise has no other concerns/at this time    Review of Systems  Constitutional: Negative.   HENT: Negative.    Eyes: Negative.   Respiratory:  Negative.    Cardiovascular: Negative.   Endocrine: Negative.   Musculoskeletal: Negative.   Neurological: Negative.   Hematological: Negative.   Psychiatric/Behavioral: Negative.    All other systems reviewed and are negative.    Objective: Vital Signs: There were no vitals taken for this visit.  Physical Exam Vitals and nursing note reviewed.  Constitutional:      Appearance: She is well-developed.  HENT:     Head: Normocephalic and atraumatic.     Nose: Nose normal.  Eyes:     Extraocular Movements: Extraocular movements intact.  Cardiovascular:     Pulses: Normal pulses.  Pulmonary:     Effort: Pulmonary effort is normal.  Abdominal:     Palpations: Abdomen is soft.  Musculoskeletal:     Cervical back: Neck supple.  Skin:    General: Skin is warm.     Capillary Refill: Capillary refill takes less than 2 seconds.  Neurological:     Mental Status: She is alert and oriented to person, place, and time. Mental status is at baseline.  Psychiatric:        Behavior: Behavior normal.        Thought Content: Thought content normal.  Judgment: Judgment normal.     Ortho Exam  Hand, Right: Inspection yielded no erythema, ecchymosis, bony deformity, or swelling. ROM of the 3rd digit is full in extension and flexion without pain. Palpation is normal over metacarpals, scaphoid, lunate, and TFCC; tendons without tenderness/swelling. Strength 5/5 in all directions without pain.  Specialty Comments:  No specialty comments available.  Imaging: XR Hand Complete Right  Result Date: 11/02/2022 Three-view hand x-ray PA, oblique and lateral.  Metacarpals are straight with no angulation or displacement.  Phalanges CMC joints appear evenly spaced with no narrowing or dislocation.  MCP joints symmetric with no signs of narrowing or widening.  IP joints show clear joint spaces no evidence of effusion or degeneration.  No soft tissue swelling noted.      PMFS History: There are  no problems to display for this patient.  History reviewed. No pertinent past medical history.  History reviewed. No pertinent family history.  Past Surgical History:  Procedure Laterality Date   APPENDECTOMY     KNEE SURGERY     MANDIBLE SURGERY     Social History   Occupational History   Not on file  Tobacco Use   Smoking status: Former   Smokeless tobacco: Never  Substance and Sexual Activity   Alcohol use: Yes   Drug use: Not Currently   Sexual activity: Not on file

## 2022-11-04 ENCOUNTER — Other Ambulatory Visit: Payer: Self-pay

## 2022-11-09 ENCOUNTER — Other Ambulatory Visit (HOSPITAL_COMMUNITY): Payer: Self-pay

## 2022-11-09 ENCOUNTER — Other Ambulatory Visit: Payer: Self-pay

## 2022-11-09 ENCOUNTER — Other Ambulatory Visit (HOSPITAL_BASED_OUTPATIENT_CLINIC_OR_DEPARTMENT_OTHER): Payer: Self-pay

## 2022-11-09 DIAGNOSIS — E663 Overweight: Secondary | ICD-10-CM | POA: Diagnosis not present

## 2022-11-09 DIAGNOSIS — Z0000 Encounter for general adult medical examination without abnormal findings: Secondary | ICD-10-CM | POA: Diagnosis not present

## 2022-11-09 DIAGNOSIS — D649 Anemia, unspecified: Secondary | ICD-10-CM | POA: Diagnosis not present

## 2022-11-09 DIAGNOSIS — J45990 Exercise induced bronchospasm: Secondary | ICD-10-CM | POA: Diagnosis not present

## 2022-11-09 DIAGNOSIS — F322 Major depressive disorder, single episode, severe without psychotic features: Secondary | ICD-10-CM | POA: Diagnosis not present

## 2022-11-09 MED ORDER — ESCITALOPRAM OXALATE 20 MG PO TABS
20.0000 mg | ORAL_TABLET | Freq: Every day | ORAL | 1 refills | Status: DC
  Filled 2022-11-09 – 2022-12-20 (×2): qty 90, 90d supply, fill #0
  Filled 2023-03-25: qty 90, 90d supply, fill #1

## 2022-11-09 MED ORDER — ATORVASTATIN CALCIUM 10 MG PO TABS
10.0000 mg | ORAL_TABLET | Freq: Every day | ORAL | 3 refills | Status: AC
  Filled 2022-11-09 – 2022-12-20 (×2): qty 90, 90d supply, fill #0
  Filled 2023-03-25: qty 90, 90d supply, fill #1
  Filled 2023-06-18: qty 90, 90d supply, fill #2
  Filled 2023-09-09: qty 90, 90d supply, fill #3

## 2022-11-09 MED ORDER — BUPROPION HCL ER (XL) 150 MG PO TB24
150.0000 mg | ORAL_TABLET | Freq: Every morning | ORAL | 1 refills | Status: DC
  Filled 2022-11-09: qty 90, 90d supply, fill #0
  Filled 2023-02-02 – 2023-02-21 (×2): qty 90, 90d supply, fill #1

## 2022-11-16 ENCOUNTER — Other Ambulatory Visit (HOSPITAL_COMMUNITY): Payer: Self-pay

## 2022-11-16 ENCOUNTER — Encounter: Payer: Self-pay | Admitting: Pharmacist

## 2022-11-16 ENCOUNTER — Encounter: Payer: Self-pay | Admitting: Orthopaedic Surgery

## 2022-11-16 ENCOUNTER — Other Ambulatory Visit: Payer: Self-pay | Admitting: Orthopaedic Surgery

## 2022-11-16 ENCOUNTER — Other Ambulatory Visit: Payer: Self-pay

## 2022-11-16 MED ORDER — PREDNISONE 10 MG (21) PO TBPK
ORAL_TABLET | ORAL | 3 refills | Status: DC
  Filled 2022-11-16: qty 21, 6d supply, fill #0

## 2022-11-17 ENCOUNTER — Other Ambulatory Visit (HOSPITAL_COMMUNITY): Payer: Self-pay

## 2022-11-27 DIAGNOSIS — G4733 Obstructive sleep apnea (adult) (pediatric): Secondary | ICD-10-CM | POA: Diagnosis not present

## 2022-11-30 DIAGNOSIS — R32 Unspecified urinary incontinence: Secondary | ICD-10-CM | POA: Diagnosis not present

## 2022-11-30 DIAGNOSIS — Z01419 Encounter for gynecological examination (general) (routine) without abnormal findings: Secondary | ICD-10-CM | POA: Diagnosis not present

## 2022-11-30 DIAGNOSIS — D649 Anemia, unspecified: Secondary | ICD-10-CM | POA: Diagnosis not present

## 2022-12-21 ENCOUNTER — Other Ambulatory Visit: Payer: Self-pay

## 2022-12-27 DIAGNOSIS — G4733 Obstructive sleep apnea (adult) (pediatric): Secondary | ICD-10-CM | POA: Diagnosis not present

## 2023-01-04 ENCOUNTER — Other Ambulatory Visit (HOSPITAL_COMMUNITY): Payer: Self-pay

## 2023-01-04 MED ORDER — OMEPRAZOLE 40 MG PO CPDR
40.0000 mg | DELAYED_RELEASE_CAPSULE | Freq: Two times a day (BID) | ORAL | 1 refills | Status: DC
  Filled 2023-01-04 – 2023-02-21 (×2): qty 180, 90d supply, fill #0
  Filled 2023-06-18: qty 180, 90d supply, fill #1

## 2023-01-24 ENCOUNTER — Other Ambulatory Visit (HOSPITAL_COMMUNITY): Payer: Self-pay

## 2023-01-26 DIAGNOSIS — G4733 Obstructive sleep apnea (adult) (pediatric): Secondary | ICD-10-CM | POA: Diagnosis not present

## 2023-01-28 DIAGNOSIS — K2970 Gastritis, unspecified, without bleeding: Secondary | ICD-10-CM | POA: Diagnosis not present

## 2023-01-28 DIAGNOSIS — K3189 Other diseases of stomach and duodenum: Secondary | ICD-10-CM | POA: Diagnosis not present

## 2023-01-28 DIAGNOSIS — R1310 Dysphagia, unspecified: Secondary | ICD-10-CM | POA: Diagnosis not present

## 2023-01-28 DIAGNOSIS — R1013 Epigastric pain: Secondary | ICD-10-CM | POA: Diagnosis not present

## 2023-01-28 DIAGNOSIS — K219 Gastro-esophageal reflux disease without esophagitis: Secondary | ICD-10-CM | POA: Diagnosis not present

## 2023-02-02 ENCOUNTER — Other Ambulatory Visit (HOSPITAL_COMMUNITY): Payer: Self-pay

## 2023-02-03 ENCOUNTER — Other Ambulatory Visit: Payer: Self-pay

## 2023-02-07 ENCOUNTER — Other Ambulatory Visit: Payer: Self-pay

## 2023-02-21 ENCOUNTER — Other Ambulatory Visit: Payer: Self-pay

## 2023-02-26 DIAGNOSIS — G4733 Obstructive sleep apnea (adult) (pediatric): Secondary | ICD-10-CM | POA: Diagnosis not present

## 2023-03-18 DIAGNOSIS — H40013 Open angle with borderline findings, low risk, bilateral: Secondary | ICD-10-CM | POA: Diagnosis not present

## 2023-03-18 DIAGNOSIS — H40003 Preglaucoma, unspecified, bilateral: Secondary | ICD-10-CM | POA: Diagnosis not present

## 2023-03-18 DIAGNOSIS — H04123 Dry eye syndrome of bilateral lacrimal glands: Secondary | ICD-10-CM | POA: Diagnosis not present

## 2023-03-25 ENCOUNTER — Other Ambulatory Visit (HOSPITAL_COMMUNITY): Payer: Self-pay

## 2023-04-08 ENCOUNTER — Ambulatory Visit: Payer: 59

## 2023-04-08 ENCOUNTER — Ambulatory Visit
Admission: RE | Admit: 2023-04-08 | Discharge: 2023-04-08 | Disposition: A | Discharge status: Home / Self Care | Payer: 59 | Source: Ambulatory Visit | Attending: Emergency Medicine | Admitting: Emergency Medicine

## 2023-04-08 ENCOUNTER — Other Ambulatory Visit: Payer: Self-pay

## 2023-04-08 VITALS — BP 118/75 | HR 79 | Temp 98.0°F | Resp 16

## 2023-04-08 DIAGNOSIS — M79672 Pain in left foot: Secondary | ICD-10-CM

## 2023-04-08 DIAGNOSIS — M7732 Calcaneal spur, left foot: Secondary | ICD-10-CM | POA: Diagnosis not present

## 2023-04-08 DIAGNOSIS — M79605 Pain in left leg: Secondary | ICD-10-CM

## 2023-04-08 DIAGNOSIS — M25572 Pain in left ankle and joints of left foot: Secondary | ICD-10-CM

## 2023-04-08 DIAGNOSIS — M79662 Pain in left lower leg: Secondary | ICD-10-CM | POA: Diagnosis not present

## 2023-04-08 MED ORDER — NAPROXEN 500 MG PO TABS
500.0000 mg | ORAL_TABLET | Freq: Two times a day (BID) | ORAL | 0 refills | Status: AC
Start: 2023-04-08 — End: ?

## 2023-04-08 NOTE — Discharge Instructions (Addendum)
 The x-rays of your left lower leg, left ankle and left foot show no significant change when compared to the same images performed in 2019.  I do not see any evidence of acute fracture or joint dislocation.  At this time, I believe that your pain is due to muscular strain.    As you are probably aware, x-ray is not useful in determining severity of muscular strain.  For this reason, I recommend that you wear your cam boot for the next week or so just to immobilize your ankle and foot and give it a chance to heal.  I provided you with a prescription for naproxen that you can take to 3 times daily as needed for pain.  Ice is also a great pain reliever.  If you are not feeling better in the next week or so, I recommend that you follow-up with orthopedics for further evaluation of muscular strain.  Thank you for visiting [facility] today.

## 2023-04-08 NOTE — ED Triage Notes (Signed)
 PT reports Lt leg pain after she fell out of a truck this WED. PT reports pain in leg when standing on it.

## 2023-04-08 NOTE — ED Provider Notes (Signed)
 [facility]    CSN: [id number] Arrival date & time: 04/08/23  1751    HISTORY   Chief Complaint  Patient presents with   Ankle Pain    Entered by patient   Leg Pain   HPI Maria Wilkins is a pleasant, 43 y.o. female who presents to urgent care today. Patient states that 2 days ago, she was getting out of a tall pickup truck with her hands full.  Patient states that when she attempted to step down onto the running board, she messed and fell to the ground on her left side.  Patient states she does not know exactly how her foot and leg hit the ground because it happened very fast but adds that she managed not to spill her diet Dr. Reino Kent that she had in her hand.  Patient states she frequently falls, often rolling her ankles, states she is not very coordinated.  Patient states at this time, she has tenderness of the lateral aspect of her left lower leg, left ankle and left foot  The history is provided by the patient.   History reviewed. No pertinent past medical history. There are no active problems to display for this patient.  Past Surgical History:  Procedure Laterality Date   APPENDECTOMY     KNEE SURGERY     MANDIBLE SURGERY     OB History   No obstetric history on file.    Home Medications    Prior to Admission medications   Medication Sig Start Date End Date Taking? Authorizing Provider  predniSONE (STERAPRED UNI-PAK 21 TAB) 10 MG (21) TBPK tablet Take as directed 11/16/22   Tarry Kos, MD  atorvastatin (LIPITOR) 10 MG tablet Take 1 tablet (10 mg total) by mouth daily. 11/09/22     buPROPion (WELLBUTRIN XL) 150 MG 24 hr tablet Take 1 tablet (150 mg total) by mouth every morning. 11/09/22     cyclobenzaprine (FLEXERIL) 10 MG tablet 1/2 to 1 tab q8h prn muscle spasm 06/11/19   [provider]  escitalopram (LEXAPRO) 20 MG tablet Take 1 tablet (20 mg total) by mouth daily. 11/09/22     meloxicam (MOBIC) 7.5 MG tablet Take 1 tablet (7.5 mg total) by  mouth 2 (two) times daily as needed for pain. 11/02/22   Tarry Kos, MD  omeprazole (PRILOSEC) 40 MG capsule Take one capsule (40 mg dose) by mouth 2 (two) times daily before meals. 09/07/22     omeprazole (PRILOSEC) 40 MG capsule Take 1 capsule (40 mg total) by mouth 2 (two) times daily before meals 01/04/23       Family History History reviewed. No pertinent family history. Social History Social History   Tobacco Use   Smoking status: Former   Smokeless tobacco: Never  Substance Use Topics   Alcohol use: Yes   Drug use: Not Currently   Allergies   Amoxicillin, Penicillins, and Cefaclor  Review of Systems Review of Systems Pertinent findings revealed after performing a 14 point review of systems has been noted in the history of present illness.  Physical Exam Vital Signs BP 118/75 (BP Location: Right Arm)   Pulse 79   Temp 98 F (36.7 C) (Oral)   Resp 16   LMP 03/24/2023 (Approximate)   SpO2 95%   No data found.  Physical Exam Vitals and nursing note reviewed.  Constitutional:      General: She is not in acute distress.    Appearance: Normal appearance.  HENT:  Head: Normocephalic and atraumatic.  Eyes:     Pupils: Pupils are equal, round, and reactive to light.  Cardiovascular:     Rate and Rhythm: Normal rate and regular rhythm.  Pulmonary:     Effort: Pulmonary effort is normal.     Breath sounds: Normal breath sounds.  Musculoskeletal:     Cervical back: Normal range of motion and neck supple.     Left lower leg: Tenderness present. No swelling, deformity or lacerations. No edema.     Left ankle: No swelling, deformity, ecchymosis or lacerations. Tenderness present over the lateral malleolus, ATF ligament and AITF ligament. Decreased range of motion. Anterior drawer test positive.     Left Achilles Tendon: Normal.     Left foot: Decreased range of motion. Normal capillary refill. Tenderness and bony tenderness present. No swelling, deformity, laceration  or crepitus. Normal pulse.  Skin:    General: Skin is warm and dry.  Neurological:     General: No focal deficit present.     Mental Status: She is alert and oriented to person, place, and time. Mental status is at baseline.  Psychiatric:        Mood and Affect: Mood normal.        Behavior: Behavior normal.        Thought Content: Thought content normal.        Judgment: Judgment normal.     Visual Acuity Right Eye Distance:   Left Eye Distance:   Bilateral Distance:    Right Eye Near:   Left Eye Near:    Bilateral Near:     UC Couse / Diagnostics / Procedures:     Radiology No results found.  Procedures Procedures (including critical care time) EKG  Pending results:  Labs Reviewed - No data to display  Medications Ordered in UC: Medications - No data to display  UC Diagnoses / Final Clinical Impressions(s)   I have reviewed the triage vital signs and the nursing notes.  Pertinent labs & imaging results that were available during my care of the patient were reviewed by me and considered in my medical decision making (see chart for details).    Final diagnoses:  Acute left ankle pain  Acute leg pain, left  Acute foot pain, left   X-rays of left lower leg, left ankle and left foot are unremarkable, particularly when compared to similar images performed in 2019.  Recommend naproxen for pain relief, use the cam boot when ambulating, patient states she already has a cam boot at home, and follow-up with orthopedics in 7 to 10 days if feeling no better.  Please see discharge instructions below for details of plan of care as provided to patient. ED Prescriptions     Medication Sig Dispense Auth. Provider   naproxen (NAPROSYN) 500 MG tablet Take 1 tablet (500 mg total) by mouth 2 (two) times daily. 30 tablet Theadora Rama Scales, PA-C      PDMP not reviewed this encounter.  Pending results:  Labs Reviewed - No data to display    Discharge Instructions       The x-rays of your left lower leg, left ankle and left foot show no significant change when compared to the same images performed in 2019.  I do not see any evidence of acute fracture or joint dislocation.  At this time, I believe that your pain is due to muscular strain.    As you are probably aware, x-ray is not useful in determining severity  of muscular strain.  For this reason, I recommend that you wear your cam boot for the next week or so just to immobilize your ankle and foot and give it a chance to heal.  I provided you with a prescription for naproxen that you can take to 3 times daily as needed for pain.  Ice is also a great pain reliever.  If you are not feeling better in the next week or so, I recommend that you follow-up with orthopedics for further evaluation of muscular strain.  Thank you for visiting [facility] today.      Disposition Upon Discharge:  Condition: stable for discharge home  Patient presented with an acute illness with associated systemic symptoms and significant discomfort requiring urgent management. In my opinion, this is a condition that a prudent lay person (someone who possesses an average knowledge of health and medicine) may potentially expect to result in complications if not addressed urgently such as respiratory distress, impairment of bodily function or dysfunction of bodily organs.   Routine symptom specific, illness specific and/or disease specific instructions were discussed with the patient and/or caregiver at length.   As such, the patient has been evaluated and assessed, work-up was performed and treatment was provided in alignment with urgent care protocols and evidence based medicine.  Patient/parent/caregiver has been advised that the patient may require follow up for further testing and treatment if the symptoms continue in spite of treatment, as clinically indicated and appropriate.  Patient/parent/caregiver has been advised  to return to the [facility] or PCP if no better; to PCP or the Emergency Department if new signs and symptoms develop, or if the current signs or symptoms continue to change or worsen for further workup, evaluation and treatment as clinically indicated and appropriate  The patient will follow up with their current PCP if and as advised. If the patient does not currently have a PCP we will assist them in obtaining one.   The patient may need specialty follow up if the symptoms continue, in spite of conservative treatment and management, for further workup, evaluation, consultation and treatment as clinically indicated and appropriate.  Patient/parent/caregiver verbalized understanding and agreement of plan as discussed.  All questions were addressed during visit.  Please see discharge instructions below for further details of plan.  This office note has been dictated using Teaching laboratory technician.  Unfortunately, this method of dictation can sometimes lead to typographical or grammatical errors.  I apologize for your inconvenience in advance if this occurs.  Please do not hesitate to reach out to me if clarification is needed.      Theadora Rama Scales, [place] 04/08/23 [phone]

## 2023-04-13 NOTE — Progress Notes (Deleted)
   Office Visit Note   Patient: Maria Wilkins           Date of Birth: 08-13-1980           MRN: [id number] Visit Date: 04/14/2023              Requested by:  Arlie Solomons, MD [address] [place],  [place] [address]  PCP: Arlie Solomons, MD   Assessment & Plan: Visit Diagnoses: No diagnosis found.  Plan: ***  Follow-Up Instructions: No follow-ups on file.   Orders:  No orders of the defined types were placed in this encounter. No orders of the defined types were placed in this encounter.    Procedures: No procedures performed   Clinical Data: No additional findings.   Subjective: No chief complaint on file.  HPI  Review of Systems   Objective: Vital Signs: LMP 03/24/2023 (Approximate)   Physical Exam  Ortho Exam  Specialty Comments:  No specialty comments available.  Imaging: No results found.   PMFS History: There are no active problems to display for this patient. No past medical history on file.  No family history on file.  Past Surgical History:  Procedure Laterality Date  . APPENDECTOMY    . KNEE SURGERY    . MANDIBLE SURGERY     Social History   Occupational History  . Not on file  Tobacco Use  . Smoking status: Former  . Smokeless tobacco: Never  Substance and Sexual Activity  . Alcohol use: Yes  . Drug use: Not Currently  . Sexual activity: Not on file

## 2023-04-14 ENCOUNTER — Ambulatory Visit: Admitting: Orthopaedic Surgery

## 2023-04-20 ENCOUNTER — Ambulatory Visit: Admitting: Orthopaedic Surgery

## 2023-04-21 ENCOUNTER — Ambulatory Visit: Admitting: Orthopaedic Surgery

## 2023-05-14 ENCOUNTER — Ambulatory Visit: Payer: Self-pay

## 2023-05-15 ENCOUNTER — Ambulatory Visit

## 2023-05-15 ENCOUNTER — Other Ambulatory Visit: Payer: Self-pay

## 2023-05-15 ENCOUNTER — Ambulatory Visit
Admission: EM | Admit: 2023-05-15 | Discharge: 2023-05-15 | Disposition: A | Discharge status: Home / Self Care | Attending: Family Medicine | Admitting: Family Medicine

## 2023-05-15 DIAGNOSIS — S6992XA Unspecified injury of left wrist, hand and finger(s), initial encounter: Secondary | ICD-10-CM

## 2023-05-15 DIAGNOSIS — S63502A Unspecified sprain of left wrist, initial encounter: Secondary | ICD-10-CM | POA: Diagnosis not present

## 2023-05-15 DIAGNOSIS — Z043 Encounter for examination and observation following other accident: Secondary | ICD-10-CM | POA: Diagnosis not present

## 2023-05-15 DIAGNOSIS — M25532 Pain in left wrist: Secondary | ICD-10-CM | POA: Diagnosis not present

## 2023-05-15 NOTE — ED Triage Notes (Signed)
 Patient states she tripped and fell walking up the steps and landed on her out stretched hands. C/O pain left wrist,Denies other injury

## 2023-05-15 NOTE — Discharge Instructions (Signed)
 The x-ray reading we discussed is preliminary. Your x-ray will be read by a radiologist in next few hours. If there is a discrepancy, you will be contacted, and instructed on a new plan for you care.   Over the counter NSAIDs for pain: Ibuprofen 400 mg ( Ibuprofen, Advil or Motrin, 2 tablets) and Acetaminophen 1 gram (3 of the regular strength or 2 extra strength tablets) 3-4 times a day,  take on an empty stomach before each meal and bedtime (every 6 hours) for a few days Do not take if you are pregnant/breastfeeding. Allergic to NSAIDs have a history of ulcers, intestinal bleeding or liver or kidney disease or have taken an opioid.

## 2023-05-15 NOTE — ED Provider Notes (Signed)
 [name]    CSN: [id number] Arrival date & time: 05/15/23  1258      History   Chief Complaint Chief Complaint  Patient presents with   Wrist Pain    HPI Maria Wilkins is a 43 y.o. female.   The history is provided by the patient.  Wrist Pain  Left wrist pain x 1 week.  States she fell 1 week ago while walking upstairs at a hockey game fell onto both outstretched hands.  Initially had pain in both wrists but right wrist pain has resolved.  Pain left wrist has continued, worse with any movement.  Admits swelling.  Admits difficulty lifting objects.  Denies bruising, weakness, paresthesias, elbow pain, other injury.  Denies history of prior left wrist injury.  She is right-handed.  Has been taking Naprosyn without relief.  Rates pain 5 out of 10.   History reviewed. No pertinent past medical history.  There are no active problems to display for this patient.   Past Surgical History:  Procedure Laterality Date   APPENDECTOMY     KNEE SURGERY     MANDIBLE SURGERY      OB History   No obstetric history on file.      Home Medications    Prior to Admission medications   Medication Sig Start Date End Date Taking? Authorizing Provider  atorvastatin (LIPITOR) 10 MG tablet Take 1 tablet (10 mg total) by mouth daily. 11/09/22     buPROPion (WELLBUTRIN XL) 150 MG 24 hr tablet Take 1 tablet (150 mg total) by mouth every morning. 11/09/22     escitalopram (LEXAPRO) 20 MG tablet Take 1 tablet (20 mg total) by mouth daily. 11/09/22     naproxen (NAPROSYN) 500 MG tablet Take 1 tablet (500 mg total) by mouth 2 (two) times daily. 04/08/23   Theadora Rama Scales, PA-C  omeprazole (PRILOSEC) 40 MG capsule Take one capsule (40 mg dose) by mouth 2 (two) times daily before meals. 09/07/22     omeprazole (PRILOSEC) 40 MG capsule Take 1 capsule (40 mg total) by mouth 2 (two) times daily before meals 01/04/23       Family History History reviewed. No pertinent family  history.  Social History Social History   Tobacco Use   Smoking status: Former   Smokeless tobacco: Never  Substance Use Topics   Alcohol use: Yes    Comment: occasional   Drug use: Not Currently     Allergies   Amoxicillin, Penicillins, and Cefaclor   Review of Systems Review of Systems   Physical Exam Triage Vital Signs ED Triage Vitals  Encounter Vitals Group     BP 05/15/23 1308 131/82     Systolic BP Percentile --      Diastolic BP Percentile --      Pulse Rate 05/15/23 1308 75     Resp 05/15/23 1308 16     Temp 05/15/23 1308 98.4 F (36.9 C)     Temp Source 05/15/23 1308 Oral     SpO2 05/15/23 1308 98 %     Weight --      Height --      Head Circumference --      Peak Flow --      Pain Score 05/15/23 1309 6     Pain Loc --      Pain Education --      Exclude from Growth Chart --    No data found.  Updated Vital Signs BP 131/82 (  BP Location: Right Arm)   Pulse 75   Temp 98.4 F (36.9 C) (Oral)   Resp 16   LMP 05/05/2023   SpO2 98%   Visual Acuity Right Eye Distance:   Left Eye Distance:   Bilateral Distance:    Right Eye Near:   Left Eye Near:    Bilateral Near:     Physical Exam Vitals and nursing note reviewed.  HENT:     Head: Normocephalic.  Cardiovascular:     Rate and Rhythm: Normal rate.  Pulmonary:     Effort: Pulmonary effort is normal. No respiratory distress.  Musculoskeletal:     Left elbow: Normal. No swelling or deformity. Normal range of motion. No tenderness.     Left forearm: Normal. No swelling, deformity, tenderness or bony tenderness.     Left wrist: Bony tenderness present. No swelling, deformity or snuff box tenderness. Normal pulse.     Left hand: Normal. No swelling or bony tenderness. Normal range of motion. Normal pulse.     Comments: Tender dorsal surface, reports pain with extension  Skin:    General: Skin is warm and dry.     Findings: No bruising.  Neurological:     Mental Status: She is alert.       UC Treatments / Results  Labs (all labs ordered are listed, but only abnormal results are displayed) Labs Reviewed - No data to display  EKG   Radiology No results found.  Procedures Procedures (including critical care time)  Medications Ordered in UC Medications - No data to display  Initial Impression / Assessment and Plan / UC Course  I have reviewed the triage vital signs and the nursing notes.  Pertinent labs & imaging results that were available during my care of the patient were reviewed by me and considered in my medical decision making (see chart for details).     Right handed 43 year old female reports left wrist pain after a fall 1 week ago.She has tenderness dorsally on the left wrist without significant swelling or bruising.  Left wrist x-ray independently viewed by me no fracture.  Likely sprain will provide wrist splint, recommend she follow-up with her PCP or orthopedist in 1 week if no improvement in symptoms, continue OTC NSAIDs  Final Clinical Impressions(s) / UC Diagnoses   Final diagnoses:  Injury, wrist, left, initial encounter   Discharge Instructions   None    ED Prescriptions   None    PDMP not reviewed this encounter.   Meliton Rattan, [place] 05/15/23 1340

## 2023-05-16 ENCOUNTER — Other Ambulatory Visit (HOSPITAL_COMMUNITY)

## 2023-05-19 ENCOUNTER — Other Ambulatory Visit: Payer: Self-pay

## 2023-05-19 ENCOUNTER — Other Ambulatory Visit (HOSPITAL_COMMUNITY): Payer: Self-pay

## 2023-05-19 MED ORDER — BUPROPION HCL ER (XL) 150 MG PO TB24
150.0000 mg | ORAL_TABLET | Freq: Every morning | ORAL | 0 refills | Status: DC
  Filled 2023-05-19: qty 90, 90d supply, fill #0

## 2023-06-03 DIAGNOSIS — M25532 Pain in left wrist: Secondary | ICD-10-CM | POA: Diagnosis not present

## 2023-06-03 DIAGNOSIS — S63592D Other specified sprain of left wrist, subsequent encounter: Secondary | ICD-10-CM | POA: Diagnosis not present

## 2023-06-16 ENCOUNTER — Other Ambulatory Visit: Payer: Self-pay

## 2023-06-16 ENCOUNTER — Other Ambulatory Visit (HOSPITAL_COMMUNITY): Payer: Self-pay

## 2023-06-16 MED ORDER — ESCITALOPRAM OXALATE 20 MG PO TABS
20.0000 mg | ORAL_TABLET | Freq: Every day | ORAL | 0 refills | Status: DC
  Filled 2023-06-16: qty 90, 90d supply, fill #0

## 2023-06-18 ENCOUNTER — Other Ambulatory Visit (HOSPITAL_COMMUNITY): Payer: Self-pay

## 2023-06-20 ENCOUNTER — Other Ambulatory Visit (HOSPITAL_COMMUNITY): Payer: Self-pay

## 2023-06-20 MED ORDER — BUPROPION HCL ER (XL) 300 MG PO TB24
300.0000 mg | ORAL_TABLET | Freq: Every morning | ORAL | 5 refills | Status: AC
Start: 2023-06-20 — End: ?
  Filled 2023-06-20: qty 30, 30d supply, fill #0
  Filled 2023-09-09: qty 30, 30d supply, fill #1
  Filled 2023-10-06: qty 30, 30d supply, fill #2
  Filled 2023-11-03: qty 30, 30d supply, fill #3

## 2023-06-21 ENCOUNTER — Other Ambulatory Visit (HOSPITAL_COMMUNITY): Payer: Self-pay

## 2023-06-27 DIAGNOSIS — G4733 Obstructive sleep apnea (adult) (pediatric): Secondary | ICD-10-CM | POA: Diagnosis not present

## 2023-06-30 DIAGNOSIS — S63502D Unspecified sprain of left wrist, subsequent encounter: Secondary | ICD-10-CM | POA: Diagnosis not present

## 2023-07-11 DIAGNOSIS — S63592D Other specified sprain of left wrist, subsequent encounter: Secondary | ICD-10-CM | POA: Diagnosis not present

## 2023-07-17 ENCOUNTER — Ambulatory Visit
Admission: EM | Admit: 2023-07-17 | Discharge: 2023-07-17 | Disposition: A | Discharge status: Home / Self Care | Attending: Emergency Medicine | Admitting: Emergency Medicine

## 2023-07-17 DIAGNOSIS — H66003 Acute suppurative otitis media without spontaneous rupture of ear drum, bilateral: Secondary | ICD-10-CM

## 2023-07-17 DIAGNOSIS — J029 Acute pharyngitis, unspecified: Secondary | ICD-10-CM | POA: Diagnosis not present

## 2023-07-17 LAB — POC COVID19/FLU A&B COMBO
Covid Antigen, POC: NEGATIVE
Influenza A Antigen, POC: NEGATIVE
Influenza B Antigen, POC: NEGATIVE

## 2023-07-17 LAB — POCT RAPID STREP A (OFFICE): Rapid Strep A Screen: NEGATIVE

## 2023-07-17 MED ORDER — LEVOFLOXACIN 750 MG PO TABS: 750.0000 mg | ORAL_TABLET | Freq: Every day | ORAL | 0 refills | Status: AC

## 2023-07-17 NOTE — ED Triage Notes (Signed)
 Pt c/o bilateral ear pain, sore throat, hoarseness, and sinus pain.   Start Date: Thursday or Friday   Interventions: Benadryl, Tylenol  Cold/Flu

## 2023-07-17 NOTE — ED Provider Notes (Signed)
 [facility]    CSN: [id number] Arrival date & time: 07/17/23  1510    HISTORY   Chief Complaint  Patient presents with   Otalgia   Sore Throat   HPI Maria Wilkins is a pleasant, 43 y.o. female who presents to urgent care today. Patient complains of a 2-3 day history of bilateral ear pain, sore throat, hoarseness of voice and sinus pain.  Patient has normal vital signs on arrival today.  Patient denies fever, body aches, chills, nausea, vomiting, diarrhea, headache, known sick contacts.  Patient states taking Benadryl, Tylenol  Cold and flu without meaningful relief of symptoms.  The history is provided by the patient.  Otalgia Sore Throat   History reviewed. No pertinent past medical history. There are no active problems to display for this patient.  Past Surgical History:  Procedure Laterality Date   APPENDECTOMY     KNEE SURGERY     MANDIBLE SURGERY     OB History     Gravida  1   Para      Term      Preterm      AB      Living         SAB      IAB      Ectopic      Multiple      Live Births  1          Home Medications    Prior to Admission medications   Medication Sig Start Date End Date Taking? Authorizing Provider  atorvastatin  (LIPITOR) 10 MG tablet Take 1 tablet (10 mg total) by mouth daily. 11/09/22     buPROPion  (WELLBUTRIN  XL) 150 MG 24 hr tablet Take 1 tablet (150 mg total) by mouth every morning. 05/19/23     buPROPion  (WELLBUTRIN  XL) 300 MG 24 hr tablet Take 1 tablet (300 mg total) by mouth in the morning. 06/20/23     escitalopram  (LEXAPRO ) 20 MG tablet Take 1 tablet (20 mg total) by mouth daily. 06/16/23     naproxen  (NAPROSYN ) 500 MG tablet Take 1 tablet (500 mg total) by mouth 2 (two) times daily. 04/08/23   Eloise Hake Scales, PA-C  omeprazole  (PRILOSEC) 40 MG capsule Take one capsule (40 mg dose) by mouth 2 (two) times daily before meals. 09/07/22     omeprazole  (PRILOSEC) 40 MG capsule Take 1 capsule (40 mg total)  by mouth 2 (two) times daily before meals 01/04/23       Family History History reviewed. No pertinent family history. Social History Social History   Tobacco Use   Smoking status: Former    Passive exposure: Never   Smokeless tobacco: Never  Vaping Use   Vaping status: Never Used  Substance Use Topics   Alcohol use: Yes    Comment: occasional   Drug use: Not Currently   Allergies   Amoxicillin, Penicillins, and Cefaclor  Review of Systems Review of Systems  HENT:  Positive for ear pain.    Pertinent findings revealed after performing a 14 point review of systems has been noted in the history of present illness.  Physical Exam Vital Signs BP 122/83 (BP Location: Right Arm)   Pulse 89   Temp 98.7 F (37.1 C) (Temporal)   Resp 18   LMP 07/10/2023 (Approximate)   SpO2 98%   No data found.  Physical Exam Vitals and nursing note reviewed.  Constitutional:      General: She is not in acute distress.  Appearance: Normal appearance. She is not ill-appearing.  HENT:     Head: Normocephalic and atraumatic.     Salivary Glands: Right salivary gland is not diffusely enlarged or tender. Left salivary gland is not diffusely enlarged or tender.     Right Ear: Hearing, ear canal and external ear normal. Tenderness present. No drainage. A middle ear effusion is present. There is no impacted cerumen. Tympanic membrane is injected, erythematous and bulging.     Left Ear: Hearing, ear canal and external ear normal. No drainage. A middle ear effusion is present. There is no impacted cerumen. Tympanic membrane is injected, erythematous and bulging.     Nose: Nose normal. No nasal deformity, septal deviation, mucosal edema, congestion or rhinorrhea.     Right Turbinates: Not enlarged, swollen or pale.     Left Turbinates: Not enlarged, swollen or pale.     Right Sinus: No maxillary sinus tenderness or frontal sinus tenderness.     Left Sinus: No maxillary sinus tenderness or frontal  sinus tenderness.     Mouth/Throat:     Lips: Pink. No lesions.     Mouth: Mucous membranes are moist. No oral lesions.     Pharynx: Oropharynx is clear. Uvula midline. No posterior oropharyngeal erythema or uvula swelling.     Tonsils: No tonsillar exudate. 0 on the right. 0 on the left.  Eyes:     General: Lids are normal.        Right eye: No discharge.        Left eye: No discharge.     Extraocular Movements: Extraocular movements intact.     Conjunctiva/sclera: Conjunctivae normal.     Right eye: Right conjunctiva is not injected.     Left eye: Left conjunctiva is not injected.  Neck:     Trachea: Trachea and phonation normal.  Cardiovascular:     Rate and Rhythm: Normal rate and regular rhythm.     Pulses: Normal pulses.     Heart sounds: Normal heart sounds. No murmur heard.    No friction rub. No gallop.  Pulmonary:     Effort: Pulmonary effort is normal. No accessory muscle usage, prolonged expiration or respiratory distress.     Breath sounds: Normal breath sounds. No stridor, decreased air movement or transmitted upper airway sounds. No decreased breath sounds, wheezing, rhonchi or rales.  Chest:     Chest wall: No tenderness.  Musculoskeletal:        General: Normal range of motion.     Cervical back: Full passive range of motion without pain, normal range of motion and neck supple. Normal range of motion.  Lymphadenopathy:     Cervical: Cervical adenopathy present.     Right cervical: Superficial cervical adenopathy present.     Left cervical: Superficial cervical adenopathy present.  Skin:    General: Skin is warm and dry.     Findings: No erythema or rash.  Neurological:     General: No focal deficit present.     Mental Status: She is alert and oriented to person, place, and time.  Psychiatric:        Mood and Affect: Mood normal.        Behavior: Behavior normal.     Visual Acuity Right Eye Distance:   Left Eye Distance:   Bilateral Distance:    Right  Eye Near:   Left Eye Near:    Bilateral Near:     [facility] Couse / Diagnostics / Procedures:  Radiology No results found.  Procedures Procedures (including critical care time) EKG  Pending results:  Labs Reviewed  CULTURE, GROUP A STREP [facility])  POCT RAPID STREP A (OFFICE)  POC COVID19/FLU A&B COMBO    Medications Ordered in UC: Medications - No data to display  UC Diagnoses / Final Clinical Impressions(s)   I have reviewed the triage vital signs and the nursing notes.  Pertinent labs & imaging results that were available during my care of the patient were reviewed by me and considered in my medical decision making (see chart for details).    Final diagnoses:  Acute suppurative otitis media of both ears without spontaneous rupture of tympanic membranes, recurrence not specified  Acute pharyngitis, unspecified etiology   Patient advised of negative COVID-19, influenza and rapid strep test.  Streptococcal throat culture pending.  Physical exam findings concerning for bilateral otitis media.  Patient states she has not had an ear infection in 10 years or more.  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  Patient states that when she takes cefaclor she gets a rash.  Will avoid beta-lactam antibiotics altogether.  Patient provided with a 10-day course of levofloxacin given moderate to severe bilateral otitis media.  Conservative care recommended.  Return precautions advised.  Please see discharge instructions below for details of plan of care as provided to patient. ED Prescriptions     Medication Sig Dispense Auth. Provider   levofloxacin (LEVAQUIN) 750 MG tablet Take 1 tablet (750 mg total) by mouth daily for 10 days. 10 tablet Eloise Hake Scales, PA-C      PDMP not reviewed this encounter.  Pending results:  Labs Reviewed  CULTURE, GROUP A STREP [facility])  POCT RAPID STREP A (OFFICE)  POC COVID19/FLU A&B COMBO      Discharge Instructions       Your strep test today is negative.  Streptococcal throat culture will be performed per our protocol.  The result of your throat culture will be posted to your MyChart once it is complete, this typically takes 3 to 5 days.  If your streptococcal throat culture is positive, you will be contacted by phone another antibiotic will prescribed for you.   Your rapid influenza antigen test today was negative.  No further influenza testing is indicated.     Your COVID-19 test is negative.  Please consider retesting in the next 2 to 3 days, particularly if you are not feeling any better.  You are welcome to return here to urgent care to have it done or you can take a home COVID-19 test.     If both your COVID-19 tests are negative, then you can safely assume that your illness is due to one of the many less serious illnesses circulating in our community right now.     Conservative care is recommended with rest, drinking plenty of clear fluids, eating only when hungry, taking supportive medications for your symptoms and avoiding being around other people.  Please remain at home until you are fever free for 24 hours without the use of antifever medications such as Tylenol  and ibuprofen .   Please read below to learn more about the medications, dosages and frequencies that I recommend to help alleviate your symptoms and to get you feeling better soon:   Levofloxacin: Please take one (1) dose daily for 10 days.  This antibiotic can cause upset stomach, this will resolve once antibiotics are complete.  You are welcome to take a probiotic, eat yogurt, take Imodium  while taking this medication.  Please avoid other systemic medications such as Maalox, Pepto-Bismol or milk of magnesia as they can interfere with the body's ability to absorb the antibiotics.    Advil , Motrin  (ibuprofen ): This is a good anti-inflammatory medication which addresses aches, pains and inflammation of the upper airways that causes sinus and  nasal congestion as well as in the lower airways which makes your cough feel tight and sometimes burn.  I recommend that you take between 400 to 600 mg every 6-8 hours as needed.  Please do not take more than 2400 mg of ibuprofen  in a 24-hour period and please do not take high doses of ibuprofen  for more than 3 days in a row as this can lead to stomach ulcers.   Chloraseptic Throat Spray: Spray 5 sprays into affected area every 2 hours, hold for 15 seconds and either swallow or spit it out.  This is a excellent numbing medication.  Because it is a spray, you can apply it where your throat hurts and avoid numbing your entire mouth as a sore throat lozenge will.     If symptoms have not meaningfully improved in the next 5 to 7 days, please return for repeat evaluation or follow-up with your regular provider.  If symptoms have worsened in the next 3 to 5 days, please go to the emergency room for further evaluation.    Thank you for visiting urgent care today.  We appreciate the opportunity to participate in your care.     Disposition Upon Discharge:  Condition: stable for discharge home  Patient presented with an acute illness with associated systemic symptoms and significant discomfort requiring urgent management. In my opinion, this is a condition that a prudent lay person (someone who possesses an average knowledge of health and medicine) may potentially expect to result in complications if not addressed urgently such as respiratory distress, impairment of bodily function or dysfunction of bodily organs.   Routine symptom specific, illness specific and/or disease specific instructions were discussed with the patient and/or caregiver at length.   As such, the patient has been evaluated and assessed, work-up was performed and treatment was provided in alignment with urgent care protocols and evidence based medicine.  Patient/parent/caregiver has been advised that the patient may require follow up for  further testing and treatment if the symptoms continue in spite of treatment, as clinically indicated and appropriate.  Patient/parent/caregiver has been advised to return to the [facility] or PCP if no better; to PCP or the Emergency Department if new signs and symptoms develop, or if the current signs or symptoms continue to change or worsen for further workup, evaluation and treatment as clinically indicated and appropriate  The patient will follow up with their current PCP if and as advised. If the patient does not currently have a PCP we will assist them in obtaining one.   The patient may need specialty follow up if the symptoms continue, in spite of conservative treatment and management, for further workup, evaluation, consultation and treatment as clinically indicated and appropriate.  Patient/parent/caregiver verbalized understanding and agreement of plan as discussed.  All questions were addressed during visit.  Please see discharge instructions below for further details of plan.  This office note has been dictated using Teaching laboratory technician.  Unfortunately, this method of dictation can sometimes lead to typographical or grammatical errors.  I apologize for your inconvenience in advance if this occurs.  Please do not hesitate to reach out to me if  clarification is needed.      Eloise Hake Scales, [place] 07/17/23 [phone]

## 2023-07-17 NOTE — Discharge Instructions (Signed)
 Your strep test today is negative.  Streptococcal throat culture will be performed per our protocol.  The result of your throat culture will be posted to your MyChart once it is complete, this typically takes 3 to 5 days.  If your streptococcal throat culture is positive, you will be contacted by phone another antibiotic will prescribed for you.   Your rapid influenza antigen test today was negative.  No further influenza testing is indicated.     Your COVID-19 test is negative.  Please consider retesting in the next 2 to 3 days, particularly if you are not feeling any better.  You are welcome to return here to urgent care to have it done or you can take a home COVID-19 test.     If both your COVID-19 tests are negative, then you can safely assume that your illness is due to one of the many less serious illnesses circulating in our community right now.     Conservative care is recommended with rest, drinking plenty of clear fluids, eating only when hungry, taking supportive medications for your symptoms and avoiding being around other people.  Please remain at home until you are fever free for 24 hours without the use of antifever medications such as Tylenol  and ibuprofen .   Please read below to learn more about the medications, dosages and frequencies that I recommend to help alleviate your symptoms and to get you feeling better soon:   Levofloxacin: Please take one (1) dose daily for 10 days.  This antibiotic can cause upset stomach, this will resolve once antibiotics are complete.  You are welcome to take a probiotic, eat yogurt, take Imodium while taking this medication.  Please avoid other systemic medications such as Maalox, Pepto-Bismol or milk of magnesia as they can interfere with the body's ability to absorb the antibiotics.    Advil , Motrin  (ibuprofen ): This is a good anti-inflammatory medication which addresses aches, pains and inflammation of the upper airways that causes sinus and nasal  congestion as well as in the lower airways which makes your cough feel tight and sometimes burn.  I recommend that you take between 400 to 600 mg every 6-8 hours as needed.  Please do not take more than 2400 mg of ibuprofen  in a 24-hour period and please do not take high doses of ibuprofen  for more than 3 days in a row as this can lead to stomach ulcers.   Chloraseptic Throat Spray: Spray 5 sprays into affected area every 2 hours, hold for 15 seconds and either swallow or spit it out.  This is a excellent numbing medication.  Because it is a spray, you can apply it where your throat hurts and avoid numbing your entire mouth as a sore throat lozenge will.     If symptoms have not meaningfully improved in the next 5 to 7 days, please return for repeat evaluation or follow-up with your regular provider.  If symptoms have worsened in the next 3 to 5 days, please go to the emergency room for further evaluation.    Thank you for visiting urgent care today.  We appreciate the opportunity to participate in your care.

## 2023-07-20 ENCOUNTER — Ambulatory Visit: Payer: Self-pay | Admitting: Emergency Medicine

## 2023-07-20 LAB — CULTURE, GROUP A STREP (THRC)

## 2023-08-19 DIAGNOSIS — Z1231 Encounter for screening mammogram for malignant neoplasm of breast: Secondary | ICD-10-CM | POA: Diagnosis not present

## 2023-08-19 DIAGNOSIS — R92323 Mammographic fibroglandular density, bilateral breasts: Secondary | ICD-10-CM | POA: Diagnosis not present

## 2023-08-19 DIAGNOSIS — R748 Abnormal levels of other serum enzymes: Secondary | ICD-10-CM | POA: Diagnosis not present

## 2023-09-09 ENCOUNTER — Other Ambulatory Visit (HOSPITAL_COMMUNITY): Payer: Self-pay

## 2023-09-09 ENCOUNTER — Encounter: Payer: Self-pay | Admitting: Pharmacist

## 2023-09-09 ENCOUNTER — Other Ambulatory Visit: Payer: Self-pay

## 2023-09-09 MED ORDER — ESCITALOPRAM OXALATE 20 MG PO TABS
20.0000 mg | ORAL_TABLET | Freq: Every day | ORAL | 0 refills | Status: AC
  Filled 2023-09-09 – 2023-10-06 (×2): qty 90, 90d supply, fill #0

## 2023-09-14 ENCOUNTER — Other Ambulatory Visit: Payer: Self-pay

## 2023-09-28 DIAGNOSIS — G4733 Obstructive sleep apnea (adult) (pediatric): Secondary | ICD-10-CM | POA: Diagnosis not present

## 2023-10-06 ENCOUNTER — Other Ambulatory Visit (HOSPITAL_COMMUNITY): Payer: Self-pay

## 2023-10-06 ENCOUNTER — Other Ambulatory Visit: Payer: Self-pay | Admitting: Medical Genetics

## 2023-10-06 DIAGNOSIS — Z006 Encounter for examination for normal comparison and control in clinical research program: Secondary | ICD-10-CM

## 2023-10-07 ENCOUNTER — Other Ambulatory Visit: Payer: Self-pay

## 2023-10-07 ENCOUNTER — Other Ambulatory Visit (HOSPITAL_COMMUNITY): Payer: Self-pay

## 2023-10-07 MED ORDER — OMEPRAZOLE 40 MG PO CPDR
40.0000 mg | DELAYED_RELEASE_CAPSULE | Freq: Two times a day (BID) | ORAL | 0 refills | Status: AC
  Filled 2023-10-07: qty 180, 90d supply, fill #0

## 2023-10-18 ENCOUNTER — Other Ambulatory Visit

## 2023-10-31 ENCOUNTER — Other Ambulatory Visit (HOSPITAL_COMMUNITY): Payer: Self-pay

## 2023-11-03 ENCOUNTER — Other Ambulatory Visit (HOSPITAL_COMMUNITY): Payer: Self-pay

## 2023-11-03 ENCOUNTER — Other Ambulatory Visit: Payer: Self-pay

## 2023-11-04 ENCOUNTER — Encounter (HOSPITAL_COMMUNITY): Payer: Self-pay

## 2023-11-04 ENCOUNTER — Other Ambulatory Visit (HOSPITAL_COMMUNITY): Payer: Self-pay

## 2023-11-18 ENCOUNTER — Other Ambulatory Visit: Payer: Self-pay | Admitting: *Deleted

## 2023-11-18 DIAGNOSIS — Z006 Encounter for examination for normal comparison and control in clinical research program: Secondary | ICD-10-CM

## 2023-12-12 ENCOUNTER — Encounter: Payer: Self-pay | Admitting: Radiology

## 2024-01-23 ENCOUNTER — Ambulatory Visit: Payer: Self-pay | Admitting: Surgery

## 2024-01-23 NOTE — H&P (Signed)
 Subjective   Chief Complaint: New Consultation (gallbladder)     History of Present Illness: Maria Wilkins is a 43 y.o. female who is seen today as an office consultation at the request of Dr. Alm Ruth for evaluation of New Consultation (gallbladder) .    This is a 43 year old female in otherwise good health who presents with several years of heartburn and reflux symptoms.  She underwent a EGD as well as esophageal dilatation a year ago.  She continues to have symptoms of upper epigastric discomfort, postprandial nausea, and significant bloating.  This did not improve with Protonix .  She was on Wegovy  for some time and had significant weight loss.  She is no longer on Wegovy .  She was ruled out for Helicobacter pylori.  Ultrasound was obtained that revealed cholelithiasis with no sign of acute cholecystitis.  She comes in now to discuss elective cholecystectomy.   Review of Systems: A complete review of systems was obtained from the patient.  I have reviewed this information and discussed as appropriate with the patient.  See HPI as well for other ROS.  Review of Systems  Constitutional: Negative.   HENT: Negative.    Eyes: Negative.   Respiratory: Negative.    Cardiovascular: Negative.   Gastrointestinal:  Positive for abdominal pain, heartburn and nausea.  Genitourinary: Negative.   Musculoskeletal: Negative.   Skin: Negative.   Neurological: Negative.   Endo/Heme/Allergies: Negative.   Psychiatric/Behavioral: Negative.        Medical History: Past Medical History:  Diagnosis Date   Asthma, unspecified asthma severity, unspecified whether complicated, unspecified whether persistent (HHS-HCC)     Patient Active Problem List  Diagnosis   Asthma, exercise induced (HHS-HCC)   Borderline high cholesterol   Major depressive disorder, single episode, severe without psychotic features (CMS/HHS-HCC)   Tobacco non-user   Seasonal allergic rhinitis    Past Surgical  History:  Procedure Laterality Date   APPENDECTOMY     Knee surgery     Mandible surgery       Allergies  Allergen Reactions   Amoxicillin Anaphylaxis   Penicillin Anaphylaxis   Cefaclor Rash    Current Outpatient Medications on File Prior to Visit  Medication Sig Dispense Refill   pantoprazole  (PROTONIX ) 40 MG DR tablet Take 40 mg by mouth 2 (two) times daily     No current facility-administered medications on file prior to visit.    Family History  Problem Relation Age of Onset   Obesity Mother    Hyperlipidemia (Elevated cholesterol) Mother    Coronary Artery Disease (Blocked arteries around heart) Mother      Social History   Tobacco Use  Smoking Status Never   Passive exposure: Never  Smokeless Tobacco Never     Social History   Socioeconomic History   Marital status: Life Partner  Tobacco Use   Smoking status: Never    Passive exposure: Never   Smokeless tobacco: Never  Vaping Use   Vaping status: Unknown  Substance and Sexual Activity   Alcohol use: Yes    Alcohol/week: 0.0 - 1.0 standard drinks of alcohol   Drug use: Never   Social Drivers of Corporate Investment Banker Strain: Low Risk (06/19/2023)   Received from [facility]   Overall Financial Resource Strain (CARDIA)    Difficulty of Paying Living Expenses: Not very hard  Food Insecurity: No Food Insecurity (06/19/2023)   Received from [facility]   Hunger Vital Sign    Within the past  12 months, you worried that your food would run out before you got the money to buy more.: Never true    Within the past 12 months, the food you bought just didn't last and you didn't have money to get more.: Never true  Transportation Needs: No Transportation Needs (06/19/2023)   Received from [facility] - Transportation    Lack of Transportation (Medical): No    Lack of Transportation (Non-Medical): No  Physical Activity: Unknown (06/19/2023)   Received from [facility]   Exercise Vital  Sign    On average, how many days per week do you engage in moderate to strenuous exercise (like a brisk walk)?: 0 days  Stress: Stress Concern Present (06/19/2023)   Received from [facility] - Occupational Stress Questionnaire    Feeling of Stress : Very much  Social Connections: Moderately Integrated (06/19/2023)   Received from [facility]   Social Network    How would you rate your social network (family, work, friends)?: Adequate participation with social networks  Housing Stability: Unknown (01/23/2024)   Housing Stability Vital Sign    Homeless in the Last Year: No    Objective:    Vitals:   01/23/24 1141 01/23/24 1143  Pulse: 76   Resp: 16   Temp: 36.8 C (98.3 F)   SpO2: 98%   Weight: 77 kg (169 lb 12.8 oz)   Height: 160 cm (5' 3)   PainSc:  0-No pain    Body mass index is 30.08 kg/m.  Physical Exam   Constitutional:  WDWN in NAD, conversant, no obvious deformities; lying in bed comfortably Eyes:  Pupils equal, round; sclera anicteric; moist conjunctiva; no lid lag HENT:  Oral mucosa moist; good dentition  Neck:  No masses palpated, trachea midline; no thyromegaly Lungs:  CTA bilaterally; normal respiratory effort CV:  Regular rate and rhythm; no murmurs; extremities well-perfused with no edema Abd:  +bowel sounds, soft with mild bloating, mild tenderness in epigastrium and right upper quadrant, no palpable organomegaly; no palpable hernias Musc: Normal gait; no apparent clubbing or cyanosis in extremities Lymphatic:  No palpable cervical or axillary lymphadenopathy Skin:  Warm, dry; no sign of jaundice Psychiatric - alert and oriented x 4; calm mood and affect   Labs, Imaging and Diagnostic Testing: Alk phos 145 T. Bili 0.5 11/24/23  INDICATION: Epigastric pain COMPARISON: None  TECHNIQUE: Realtime: Multiplanar, grayscale and color Doppler ultrasound of the right upper quadrant was performed with spectral  Doppler supplementation.  FINDINGS: Aorta: Maximum size 1.9 cm diameter. No visualized aneurysm. IVC: Normal Portal vein: Normal caliber with hepatopedal flow Liver:  No focal lesions.   Normal echotexture. The liver measures 14.6 cm in craniocaudal dimensions. Pancreas: Incompletely visualized. Visualized portions are unremarkable. Gallbladder: Multiple echogenic foci with posterior acoustic shadowing consistent with stones. These measure at least 7 mm.  No wall thickening or pericholecystic fluid.  Negative sonographic Murphy's sign. Common bile duct: Measures 4 mm in diameter. No intraluminal echoes. Right kidney: Normal in size.   The right kidney measures 11.0 cm.  No hydronephrosis.   No visualized lesion.  Additional comments: None Procedure Note  Ploof, Alyce HERO, MD - 01/16/2024 Formatting of this note might be different from the original. INDICATION: Epigastric pain COMPARISON: None  TECHNIQUE: Realtime: Multiplanar, grayscale and color Doppler ultrasound of the right upper quadrant was performed with spectral Doppler supplementation.  FINDINGS: Aorta: Maximum size 1.9 cm diameter. No visualized aneurysm.  IVC: Normal Portal vein: Normal caliber with hepatopedal flow Liver:  No focal lesions.   Normal echotexture. The liver measures 14.6 cm in craniocaudal dimensions. Pancreas: Incompletely visualized. Visualized portions are unremarkable. Gallbladder: Multiple echogenic foci with posterior acoustic shadowing consistent with stones. These measure at least 7 mm.  No wall thickening or pericholecystic fluid.  Negative sonographic Murphy's sign. Common bile duct: Measures 4 mm in diameter. No intraluminal echoes. Right kidney: Normal in size.   The right kidney measures 11.0 cm.  No hydronephrosis.   No visualized lesion.  Additional comments: None    IMPRESSION: 1.  Cholelithiasis without additional ultrasound findings of acute cholecystitis.  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  Potential risks, benefits, alternative treatments, and expected outcomes have been explained.  All of the patient's questions at this time have been answered.  The likelihood of reaching the patient's treatment goal is good.  The patient understands the proposed surgical procedure and wishes to proceed.  DONNICE DEWAYNE LIMA, MD  01/23/2024 12:14 PM
